# Patient Record
Sex: Female | Born: 1980 | Race: White | Hispanic: No | Marital: Single | Smoking: Former smoker
Health system: Southern US, Community
[De-identification: ages and names within clinical notes are randomized; demographics above are authoritative.]

## PROBLEM LIST (undated history)

## (undated) DIAGNOSIS — F419 Anxiety disorder, unspecified: Secondary | ICD-10-CM

## (undated) DIAGNOSIS — E063 Autoimmune thyroiditis: Secondary | ICD-10-CM

## (undated) HISTORY — PX: OTHER SURGICAL HISTORY: SHX169

## (undated) HISTORY — DX: Anxiety disorder, unspecified: F41.9

## (undated) HISTORY — DX: Hemochromatosis, unspecified: E83.119

## (undated) HISTORY — DX: Autoimmune thyroiditis: E06.3

## (undated) HISTORY — PX: CHOLECYSTECTOMY: SHX55

## (undated) HISTORY — PX: WISDOM TOOTH EXTRACTION: SHX21

---

## 2013-12-01 DIAGNOSIS — Z1501 Genetic susceptibility to malignant neoplasm of breast: Secondary | ICD-10-CM | POA: Insufficient documentation

## 2013-12-01 HISTORY — DX: Genetic susceptibility to malignant neoplasm of breast: Z15.01

## 2016-02-11 DIAGNOSIS — G43109 Migraine with aura, not intractable, without status migrainosus: Secondary | ICD-10-CM | POA: Insufficient documentation

## 2021-05-10 DIAGNOSIS — E663 Overweight: Secondary | ICD-10-CM | POA: Insufficient documentation

## 2021-08-04 ENCOUNTER — Other Ambulatory Visit: Payer: Self-pay | Admitting: Family Medicine

## 2021-08-04 DIAGNOSIS — Z1231 Encounter for screening mammogram for malignant neoplasm of breast: Secondary | ICD-10-CM

## 2021-08-26 ENCOUNTER — Other Ambulatory Visit (HOSPITAL_COMMUNITY): Payer: Self-pay

## 2021-08-26 MED ORDER — CLONAZEPAM 0.5 MG PO TABS
ORAL_TABLET | ORAL | 0 refills | Status: DC
  Filled 2021-08-26: qty 120, 30d supply, fill #0

## 2021-08-26 MED ORDER — WEGOVY 2.4 MG/0.75ML ~~LOC~~ SOAJ
SUBCUTANEOUS | 1 refills | Status: DC
  Filled 2021-08-26 – 2021-09-05 (×2): qty 3, 28d supply, fill #0
  Filled 2022-01-04: qty 9, 84d supply, fill #0

## 2021-08-26 MED ORDER — GABAPENTIN 100 MG PO CAPS
100.0000 mg | ORAL_CAPSULE | Freq: Three times a day (TID) | ORAL | 2 refills | Status: DC | PRN
  Filled 2021-08-26: qty 30, 10d supply, fill #0

## 2021-08-26 MED ORDER — NP THYROID 15 MG PO TABS
ORAL_TABLET | ORAL | 0 refills | Status: DC
  Filled 2021-08-26: qty 90, 90d supply, fill #0

## 2021-08-26 MED ORDER — VALACYCLOVIR HCL 1 G PO TABS
1000.0000 mg | ORAL_TABLET | Freq: Three times a day (TID) | ORAL | 2 refills | Status: DC
  Filled 2021-08-26 – 2021-11-25 (×2): qty 21, 7d supply, fill #0

## 2021-08-26 MED ORDER — FLUOXETINE HCL 20 MG PO CAPS
ORAL_CAPSULE | ORAL | 1 refills | Status: DC
  Filled 2021-08-26: qty 90, 90d supply, fill #0
  Filled 2022-03-24: qty 90, 90d supply, fill #1

## 2021-08-29 DIAGNOSIS — E538 Deficiency of other specified B group vitamins: Secondary | ICD-10-CM | POA: Diagnosis not present

## 2021-08-29 DIAGNOSIS — E782 Mixed hyperlipidemia: Secondary | ICD-10-CM | POA: Diagnosis not present

## 2021-08-29 DIAGNOSIS — E559 Vitamin D deficiency, unspecified: Secondary | ICD-10-CM | POA: Diagnosis not present

## 2021-08-29 DIAGNOSIS — R739 Hyperglycemia, unspecified: Secondary | ICD-10-CM | POA: Diagnosis not present

## 2021-08-29 DIAGNOSIS — K2100 Gastro-esophageal reflux disease with esophagitis, without bleeding: Secondary | ICD-10-CM | POA: Diagnosis not present

## 2021-08-29 DIAGNOSIS — E063 Autoimmune thyroiditis: Secondary | ICD-10-CM | POA: Diagnosis not present

## 2021-09-02 ENCOUNTER — Other Ambulatory Visit (HOSPITAL_COMMUNITY): Payer: Self-pay

## 2021-09-05 ENCOUNTER — Other Ambulatory Visit (HOSPITAL_COMMUNITY): Payer: Self-pay

## 2021-09-09 ENCOUNTER — Other Ambulatory Visit (HOSPITAL_COMMUNITY): Payer: Self-pay

## 2021-09-13 ENCOUNTER — Other Ambulatory Visit (HOSPITAL_COMMUNITY): Payer: Self-pay

## 2021-09-13 MED ORDER — CHOLECALCIFEROL 1.25 MG (50000 UT) PO CAPS
50000.0000 [IU] | ORAL_CAPSULE | ORAL | 1 refills | Status: DC
  Filled 2021-09-13: qty 3, 90d supply, fill #0
  Filled 2021-11-25: qty 3, 90d supply, fill #1

## 2021-09-22 ENCOUNTER — Ambulatory Visit: Payer: Self-pay

## 2021-10-13 ENCOUNTER — Other Ambulatory Visit (HOSPITAL_COMMUNITY): Payer: Self-pay

## 2021-10-13 MED ORDER — WEGOVY 2.4 MG/0.75ML ~~LOC~~ SOAJ
SUBCUTANEOUS | 1 refills | Status: DC
  Filled 2021-10-13: qty 3, 28d supply, fill #0
  Filled 2021-11-25: qty 3, 28d supply, fill #1
  Filled 2022-01-04 – 2022-03-24 (×2): qty 3, 28d supply, fill #2
  Filled 2022-04-27: qty 3, 28d supply, fill #3
  Filled 2022-05-25: qty 3, 28d supply, fill #4
  Filled 2022-08-29: qty 3, 28d supply, fill #5
  Filled 2022-09-27: qty 3, 28d supply, fill #6

## 2021-10-25 ENCOUNTER — Other Ambulatory Visit: Payer: Self-pay

## 2021-10-25 ENCOUNTER — Ambulatory Visit
Admission: RE | Admit: 2021-10-25 | Discharge: 2021-10-25 | Disposition: A | Discharge status: Home / Self Care | Payer: 59 | Source: Ambulatory Visit | Attending: *Deleted | Admitting: *Deleted

## 2021-10-25 DIAGNOSIS — Z1231 Encounter for screening mammogram for malignant neoplasm of breast: Secondary | ICD-10-CM | POA: Diagnosis not present

## 2021-10-26 ENCOUNTER — Encounter: Payer: Self-pay | Admitting: Family Medicine

## 2021-10-26 ENCOUNTER — Ambulatory Visit: Payer: 59 | Admitting: Family Medicine

## 2021-10-26 DIAGNOSIS — R002 Palpitations: Secondary | ICD-10-CM

## 2021-10-26 DIAGNOSIS — E063 Autoimmune thyroiditis: Secondary | ICD-10-CM | POA: Insufficient documentation

## 2021-10-26 DIAGNOSIS — F419 Anxiety disorder, unspecified: Secondary | ICD-10-CM

## 2021-10-26 NOTE — Assessment & Plan Note (Signed)
Reports well controlled. No current symptoms other than occasional palpitations. States she just had labs done recently and will send [place] the results via MyChart instead of drawing blood again today. Continue current regimen for now.

## 2021-10-26 NOTE — Patient Instructions (Signed)
Referrals placed today Please send [place] your recent lab results via [place] If it is going to take you awhile to get in with Cardiology and you would like [place] to go ahead and order a heart monitor, please let us know.

## 2021-10-26 NOTE — Assessment & Plan Note (Signed)
No suicidal/homicidal ideation Well controlled on current regimen, but she is open to counseling. Educated that I would prefer not to prescribe this much Klonopin and would either like to start weaning the amount she gets or refer to psych for management - she is agreeable to have referral placed.

## 2021-10-26 NOTE — Assessment & Plan Note (Signed)
Fatigue is at baseline. Referral to hematology. Reports recent labs - she will send [place] results.

## 2021-10-26 NOTE — Progress Notes (Deleted)
There were no vitals taken for this visit.   Subjective:    Patient ID: Amber Sweeney, female    DOB: 08/15/1981, 41 y.o.   MRN: [id number]  HPI: Amber Sweeney is a 41 y.o. female presenting on 10/26/2021 for comprehensive medical examination and to establish care with new PCP. Current medical complaints include:***  She currently lives with: Interim Problems from her last visit: no   She reports regular vision exams q1-5y: {Blank single:19197::"yes","no"} She reports regular dental exams q 2550m: {Blank single:19197::"yes","no"} Her diet consists of: *** She endorses exercise and/or activity of: *** She works at: ***  She {Blank single:19197::"denies","endorses"} ETOH use. She {Blank single:19197::"denies","endorses"} nictoine use. She {Blank single:19197::"denies","endorses"} illegal substance use.    She reports ***irregular/irregular menstrual periods with {Blank single:"normal","heavy","light"} flow. Current menopausal symptoms: no She is {Blank single:19197::"currently","not currently","never"}  sexually active with {Blank single:19197} partners. She {Blank single:19197::"denies","endorses"}  concerns today about STI Contraception choices are: ***  She {Blank single:19197::"endorses","denies"} concerns about skin changes today. She {Blank single:19197::"endorses","denies"} concerns about bowel changes today. She {Blank single:19197::"endorses","denies"} concerns about bladder changes today.  *** Depression Screen done today and results listed below:  No flowsheet data found.  She {has/does not have:19849} a history of falls. I {did/did not:19850} complete a risk assessment for falls. A plan of care for falls {was/was not:19852} documented.     Past Medical History:  No past medical history on file.  Surgical History:  No past surgical history on file.  Medications:  Current Outpatient Medications on File Prior to Visit  Medication Sig   Cholecalciferol  1.25 MG (50000 UT) capsule Take 1 capsule (50,000 Units) by mouth every 30 (thirty) days.   clonazePAM (KLONOPIN) 0.5 MG tablet TAKE 1 TO 2 TABLETS BY MOUTH TWICE DAILY FOR ANXIETY   FLUoxetine (PROZAC) 20 MG capsule TAKE 1 CAPSULE BY MOUTH EVERY DAY IN THE MORNING   gabapentin (NEURONTIN) 100 MG capsule Take 1 capsule (100 mg total) by mouth 3 (three) times daily as needed for nerve pain   Semaglutide-Weight Management (WEGOVY) 2.4 MG/0.75ML SOAJ Inject 2.4MG  into the skin every week on the same day   Semaglutide-Weight Management (WEGOVY) 2.4 MG/0.75ML SOAJ Inject (2.4MG ) into the skin every week on the same day of each week   thyroid (NP THYROID) 15 MG tablet TAKE 1 TABLET BY MOUTH FIRST THING IN THE MORNING ON AN EMPTY STOMACH   valACYclovir (VALTREX) 1000 MG tablet Take 1 tablet (1,000 mg total) by mouth 3 (three) times daily for 7 days   No current facility-administered medications on file prior to visit.    Allergies:  Allergies  Allergen Reactions   Celecoxib     pt reported   Gabapentin     pt reported   Liraglutide -Weight Management     pt reported    Social History:  Social History   Socioeconomic History   Marital status: Single    Spouse name: Not on file   Number of children: Not on file   Years of education: Not on file   Highest education level: Not on file  Occupational History   Not on file  Tobacco Use   Smoking status: Not on file   Smokeless tobacco: Not on file  Substance and Sexual Activity   Alcohol use: Not on file   Drug use: Not on file   Sexual activity: Not on file  Other Topics Concern   Not on file  Social History Narrative   Not on file  Social Determinants of Health   Financial Resource Strain: Not on file  Food Insecurity: Not on file  Transportation Needs: Not on file  Physical Activity: Not on file  Stress: Not on file  Social Connections: Not on file  Intimate Partner Violence: Not on file   Social History   Tobacco Use   Smoking Status Not on file  Smokeless Tobacco Not on file   Social History   Substance and Sexual Activity  Alcohol Use Not on file    Family History:  Family History  Problem Relation Age of Onset   Breast cancer Maternal Aunt     Past medical history, surgical history, medications, allergies, family history and social history reviewed with patient today and changes made to appropriate areas of the chart.   All ROS negative except what is listed above and in the HPI.      Objective:    There were no vitals taken for this visit.  Wt Readings from Last 3 Encounters:  No data found for Wt    Physical Exam  No results found for this or any previous visit.    Assessment & Plan:   Problem List Items Addressed This Visit   None      LABORATORY TESTING:  - Health maintenance labs ordered today as discussed above.           CBC, CMP, LIPIDS          TSH - high risk or symptoms          A1c - hx, high risk, symptomatic  - STI testing: {Blank single:19197::"deferred","done today","not applicable","up to date","done elsewhere"} (USPSTF recommends Gonorrhea/Chlamydia screening (urine) in all sexually active females (or on OCP) age 57 and under; continue after age 20 if high risk) - Pap smear: {Blank single:19197::"deferred","done today","not applicable","up to date","done elsewhere"} - HIV screening: *** (everyone at least once between ages of 59-64) - Hep C screening: *** (if high risk) - Syphilis screening: *** (MSM, HIV+, high risk)   IMMUNIZATIONS:   - Tdap: Tetanus vaccination status reviewed: {tetanus status:315746}. - Influenza: {Blank single:19197::"Up to date","Administered today","Postponed to flu season","Refused","Given elsewhere"} - Pneumovax: {Blank single:19197::"Up to date","Administered today","Not applicable","Refused","Given elsewhere"} - Prevnar: {Blank single:19197::"Up to date","Administered today","Not applicable","Refused","Given elsewhere"} -  HPV: {Blank single:19197::"Up to date","Administered today","Not applicable","Refused","Given elsewhere"} - Shingrix vaccine: {Blank single:19197::"Up to date","Administered today","Not applicable","Refused","Given elsewhere"} - COVID-19: {Blank single:19197::"Up to date","Administered today","Not applicable","Refused","Given elsewhere"}  SCREENING: - Mammogram: {Blank single:19197::"Up to date","Ordered today","Not applicable","Refused","Done elsewhere"} - Bone Density: {Blank single:19197::"Up to date","Ordered today","Not applicable","Refused","Done elsewhere"} - Colonoscopy: {Blank single:19197::"Up to date","Ordered today","Not applicable","Refused","Done elsewhere"}  Discussed with patient purpose of the colonoscopy is to detect colon cancer at curable precancerous or early stages  - AAA Screening: {Blank single:19197::"Up to date","Ordered today","Not applicable","Refused","Done elsewhere"}  -Hearing Test: {Blank single:19197::"Up to date","Ordered today","Not applicable","Refused","Done elsewhere"} *** ? -Spirometry: {Blank single:19197::"Up to date","Ordered today","Not applicable","Refused","Done elsewhere"}  - Lung Cancer Screening: {Blank single:19197::"Up to date","Ordered today","Not applicable","Refused","Done elsewhere"}    PATIENT COUNSELING:   Advised to take 1 mg of folate supplement per day if capable of pregnancy.   Sexuality: Discussed sexually transmitted diseases, partner selection, use of condoms, avoidance of unintended pregnancy, and contraceptive alternatives.   Encouraged smoking cessation. *** I discussed with the patient that most people either abstain from alcohol or drink within safe limits (<=14/week and <=4 drinks/occasion for males, <=7/weeks and <= 3 drinks/occasion for females) and that the risk for alcohol disorders and other health effects rises proportionally with the number of drinks  per week and how often a drinker exceeds daily limits.  Discussed  cessation/primary prevention of drug use and availability of treatment for abuse.   Diet: Encouraged to adjust caloric intake to maintain or achieve ideal body weight, to reduce intake of dietary saturated fat and total fat, to limit sodium intake by avoiding high sodium foods and not adding table salt, and to maintain adequate dietary potassium and calcium preferably from fresh fruits, vegetables, and low-fat dairy products. Encouraged vitamin D 1000 units and Calcium 1300mg  or 4 servings of dairy a day.  Emphasized the importance of regular exercise.  Injury prevention: Discussed safety belts, safety helmets, smoke detector, smoking near bedding or upholstery.   Dental health: Discussed importance of regular tooth brushing, flossing, and dental visits.  Follow up plan:  No follow-ups on file.

## 2021-10-26 NOTE — Progress Notes (Signed)
______________________________________________________________________  HPI Amber Sweeney is a 41 y.o. female presenting to [facility] today to establish care. She and her children moved here about 4 months ago from [place]. 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     Health Maintenance  Topic Date Due   HIV Screening  Never done   Hepatitis C Screening: USPSTF Recommendation to screen - Ages 43-79 yo.  Never done   Pap Smear  Never done   COVID-19 Vaccine (3 - Booster for Pfizer series) 02/19/2020   Tetanus Vaccine  03/08/2021   Flu Shot  05/09/2021   Pneumococcal Vaccination  Aged Out   HPV Vaccine  Aged Out     Concerns today: Hemochromatosis - was set up with hematologist before moving; high ferritin, was told to give blood often (once a week for eight weeks - only did it once), baseline fatigue, metallic tastes in mouth  Wants to see a cardiologist - ever since COVID shots, she has been having spurts of HTN, resolved now, frequent dizziness; smartwatch has told her she is in Afib a few times (brother with Afib) - It has been at least 2 weeks since she has had any of these symptoms. Reports usually if she sits for a few minutes and drinks water it will pass within a couple minutes; wore a monitor 3 years ago, but it didn't catch anything. Last had blood work done a few weeks ago - reports thyroid has been stable Anxiety/PTSD - would like to see a Veterinary surgeon. She is having to use Klonopin 0.5 mg at least once a day - she is willing to see psych to manage this. Also on daily Prozac. Denies suicidal/homicidal ideation.   Patient Active Problem List   Diagnosis Date Noted   Hereditary hemochromatosis (HCC) 10/26/2021   Hashimoto's thyroiditis 10/26/2021   Anxiety 10/26/2021      PHQ9 Today: Depression screen PHQ 2/9 10/26/2021  Decreased Interest 0  Down, Depressed, Hopeless 0  PHQ - 2 Score 0   GAD7 Today: No flowsheet data  found. ______________________________________________________________________ PMH Past Medical History:  Diagnosis Date   Anxiety    Hashimoto's thyroiditis    Hemochromatosis     ROS All review of systems negative except what is listed in the HPI  PHYSICAL EXAM Physical Exam Vitals reviewed.  Constitutional:      General: She is not in acute distress.    Appearance: Normal appearance.  HENT:     Head: Normocephalic and atraumatic.     Right Ear: Tympanic membrane normal.     Left Ear: Tympanic membrane normal.     Mouth/Throat:     Mouth: Mucous membranes are moist.     Pharynx: Oropharynx is clear.  Cardiovascular:     Rate and Rhythm: Normal rate and regular rhythm.     Heart sounds: Normal heart sounds.  Pulmonary:     Effort: Pulmonary effort is normal.     Breath sounds: Normal breath sounds.  Abdominal:     Palpations: Abdomen is soft.  Musculoskeletal:        General: Normal range of motion.     Cervical back: Normal range of motion and neck supple. No tenderness.  Lymphadenopathy:     Cervical: No cervical adenopathy.  Skin:    General: Skin is warm and dry.  Neurological:     General: No focal deficit present.     Mental Status: She is alert and oriented  to person, place, and time. Mental status is at baseline.  Psychiatric:        Mood and Affect: Mood normal.        Behavior: Behavior normal.        Thought Content: Thought content normal.        Judgment: Judgment normal.   ______________________________________________________________________ ASSESSMENT AND PLAN  Hashimoto's thyroiditis Reports well controlled. No current symptoms other than occasional palpitations. States she just had labs done recently and will send [place] the results via MyChart instead of drawing blood again today. Continue current regimen for now.   Hereditary hemochromatosis (HCC) Fatigue is at baseline. Referral to hematology. Reports recent labs - she will send [place] results.    Anxiety No suicidal/homicidal ideation Well controlled on current regimen, but she is open to counseling. Educated that I would prefer not to prescribe this much Klonopin and would either like to start weaning the amount she gets or refer to psych for management - she is agreeable to have referral placed.   Palpitations Patient requesting cardiology referral. Deferred starting workup today. Educated that if it sounds like it will take her awhile to get in with cardiology to let me know and we can set up heart monitor. No symptoms today. Patient aware of signs/symptoms requiring further/urgent evaluation.    - Ambulatory referral to Cardiology - Ambulatory referral to Hematology / Oncology - Ambulatory referral to Psychiatry    Diet and Exercise recommendations discussed.  Current diagnoses and recommendations discussed. HM recommendations reviewed with recommendations.  -She reports mammogram was completed yesterday -Due for pap, will schedule CPE in a few months to have this done and update labs again   Outpatient Encounter Medications as of 10/26/2021  Medication Sig   Cholecalciferol 1.25 MG (50000 UT) capsule Take 1 capsule (50,000 Units) by mouth every 30 (thirty) days.   clonazePAM (KLONOPIN) 0.5 MG tablet TAKE 1 TO 2 TABLETS BY MOUTH TWICE DAILY FOR ANXIETY   FLUoxetine (PROZAC) 20 MG capsule TAKE 1 CAPSULE BY MOUTH EVERY DAY IN THE MORNING   Semaglutide-Weight Management (WEGOVY) 2.4 MG/0.75ML SOAJ Inject 2.4MG  into the skin every week on the same day   Semaglutide-Weight Management (WEGOVY) 2.4 MG/0.75ML SOAJ Inject (2.4MG ) into the skin every week on the same day of each week   thyroid (NP THYROID) 15 MG tablet TAKE 1 TABLET BY MOUTH FIRST THING IN THE MORNING ON AN EMPTY STOMACH   valACYclovir (VALTREX) 1000 MG tablet Take 1 tablet (1,000 mg total) by mouth 3 (three) times daily for 7 days   [DISCONTINUED] gabapentin (NEURONTIN) 100 MG capsule Take 1 capsule (100 mg  total) by mouth 3 (three) times daily as needed for nerve pain   No facility-administered encounter medications on file as of 10/26/2021.    Return in about 3 months (around 01/24/2022) for CPE, pap, labs .  Time: 32 minutes, >50% spent counseling, care coordination, chart review, and documentation.   Lollie Marrow Reola Calkins, DNP, FNP-C

## 2021-10-27 ENCOUNTER — Encounter: Payer: Self-pay | Admitting: Family Medicine

## 2021-11-01 ENCOUNTER — Emergency Department (HOSPITAL_COMMUNITY): Payer: 59

## 2021-11-01 ENCOUNTER — Other Ambulatory Visit: Payer: Self-pay | Admitting: Family

## 2021-11-01 ENCOUNTER — Emergency Department (HOSPITAL_COMMUNITY)
Admission: EM | Admit: 2021-11-01 | Discharge: 2021-11-01 | Disposition: A | Discharge status: Home / Self Care | Payer: 59 | Attending: Emergency Medicine | Admitting: Emergency Medicine

## 2021-11-01 ENCOUNTER — Encounter (HOSPITAL_COMMUNITY): Payer: Self-pay | Admitting: Emergency Medicine

## 2021-11-01 DIAGNOSIS — R0602 Shortness of breath: Secondary | ICD-10-CM | POA: Diagnosis not present

## 2021-11-01 DIAGNOSIS — R002 Palpitations: Secondary | ICD-10-CM | POA: Diagnosis not present

## 2021-11-01 DIAGNOSIS — R42 Dizziness and giddiness: Secondary | ICD-10-CM | POA: Insufficient documentation

## 2021-11-01 DIAGNOSIS — R079 Chest pain, unspecified: Secondary | ICD-10-CM | POA: Diagnosis not present

## 2021-11-01 LAB — COMPREHENSIVE METABOLIC PANEL
ALT: 17 U/L (ref 0–44)
AST: 16 U/L (ref 15–41)
Albumin: 4.1 g/dL (ref 3.5–5.0)
Alkaline Phosphatase: 44 U/L (ref 38–126)
Anion gap: 8 (ref 5–15)
BUN: 15 mg/dL (ref 6–20)
CO2: 25 mmol/L (ref 22–32)
Calcium: 8.9 mg/dL (ref 8.9–10.3)
Chloride: 105 mmol/L (ref 98–111)
Creatinine, Ser: 0.92 mg/dL (ref 0.44–1.00)
GFR, Estimated: >60 mL/min (ref >60)
Glucose, Bld: 90 mg/dL (ref 70–99)
Potassium: 4.3 mmol/L (ref 3.5–5.1)
Sodium: 138 mmol/L (ref 135–145)
Total Bilirubin: 0.5 mg/dL (ref 0.3–1.2)
Total Protein: 6.6 g/dL (ref 6.5–8.1)

## 2021-11-01 LAB — TSH: TSH: 1.534 u[IU]/mL (ref 0.350–4.500)

## 2021-11-01 LAB — URINALYSIS, ROUTINE W REFLEX MICROSCOPIC
Bilirubin Urine: NEGATIVE
Glucose, UA: NEGATIVE mg/dL
Hgb urine dipstick: NEGATIVE
Ketones, ur: NEGATIVE mg/dL
Leukocytes,Ua: NEGATIVE
Nitrite: NEGATIVE
Protein, ur: NEGATIVE mg/dL
Specific Gravity, Urine: 1.013 (ref 1.005–1.030)
pH: 6 (ref 5.0–8.0)

## 2021-11-01 LAB — CBC WITH DIFFERENTIAL/PLATELET
Abs Immature Granulocytes: 0.02 10*3/uL (ref 0.00–0.07)
Basophils Absolute: 0.1 10*3/uL (ref 0.0–0.1)
Basophils Relative: 1 %
Eosinophils Absolute: 0.1 10*3/uL (ref 0.0–0.5)
Eosinophils Relative: 2 %
HCT: 36.6 % (ref 36.0–46.0)
Hemoglobin: 12.4 g/dL (ref 12.0–15.0)
Immature Granulocytes: 0 %
Lymphocytes Relative: 35 %
Lymphs Abs: 2.5 10*3/uL (ref 0.7–4.0)
MCH: 32.5 pg (ref 26.0–34.0)
MCHC: 33.9 g/dL (ref 30.0–36.0)
MCV: 95.8 fL (ref 80.0–100.0)
Monocytes Absolute: 0.3 10*3/uL (ref 0.1–1.0)
Monocytes Relative: 5 %
Neutro Abs: 4.1 10*3/uL (ref 1.7–7.7)
Neutrophils Relative %: 57 %
Platelets: 259 10*3/uL (ref 150–400)
RBC: 3.82 MIL/uL — ABNORMAL LOW (ref 3.87–5.11)
RDW: 12.8 % (ref 11.5–15.5)
WBC: 7.1 10*3/uL (ref 4.0–10.5)
nRBC: 0 % (ref 0.0–0.2)

## 2021-11-01 LAB — PREGNANCY, URINE: Preg Test, Ur: NEGATIVE

## 2021-11-01 LAB — TROPONIN I (HIGH SENSITIVITY)
Troponin I (High Sensitivity): <2 ng/L (ref <18)
Troponin I (High Sensitivity): <2 ng/L (ref <18)

## 2021-11-01 NOTE — ED Provider Notes (Signed)
[facility] EMERGENCY DEPARTMENT Provider Note   CSN: [id number] Arrival date & time: 11/01/21  [id number]     History  Chief Complaint  Patient presents with   Dizziness    Amber Sweeney is a 41 y.o. female.   Dizziness Associated symptoms: palpitations   Associated symptoms: no chest pain, no nausea and no shortness of breath   Patient presents for palpitations and dizziness.  She has a history of palpitations and has worn a cardiac monitor in the past.  Data from this monitor showed infrequent PVCs only.  This morning, patient experienced palpitations.  She does have some associated dizziness.  She has a watch that monitors heart rate.  The wash told her that her heart rate was fast.  She was not able to identify a peak heart rate because she did not want to keep checking it and fears that it would make her more anxious and worsen her symptoms.  She does have an appointment with cardiology for next week.    Home Medications Prior to Admission medications   Medication Sig Start Date End Date Taking? Authorizing Provider  Cholecalciferol 1.25 MG (50000 UT) capsule Take 1 capsule (50,000 Units) by mouth every 30 (thirty) days. 09/13/21     clonazePAM (KLONOPIN) 0.5 MG tablet TAKE 1 TO 2 TABLETS BY MOUTH TWICE DAILY FOR ANXIETY 08/26/21     FLUoxetine (PROZAC) 20 MG capsule TAKE 1 CAPSULE BY MOUTH EVERY DAY IN THE MORNING 08/26/21     Semaglutide-Weight Management (WEGOVY) 2.4 MG/0.75ML SOAJ Inject 2.4MG  into the skin every week on the same day 08/26/21     Semaglutide-Weight Management (WEGOVY) 2.4 MG/0.75ML SOAJ Inject (2.4MG ) into the skin every week on the same day of each week 10/13/21     thyroid (NP THYROID) 15 MG tablet TAKE 1 TABLET BY MOUTH FIRST THING IN THE MORNING ON AN EMPTY STOMACH 08/26/21     valACYclovir (VALTREX) 1000 MG tablet Take 1 tablet (1,000 mg total) by mouth 3 (three) times daily for 7 days 08/26/21         Allergies    Celecoxib,  Gabapentin, Iodinated contrast media, Levothyroxine sodium, Liraglutide -weight management, Pregabalin, Wellbutrin [bupropion], Cephalosporins, and Cyclosporine    Review of Systems   Review of Systems  Respiratory:  Negative for shortness of breath.   Cardiovascular:  Positive for palpitations. Negative for chest pain.  Gastrointestinal:  Negative for nausea.  Neurological:  Positive for dizziness.  All other systems reviewed and are negative.  Physical Exam Updated Vital Signs BP 95/71 (BP Location: Right Arm)    Pulse 71    Temp 98 F (36.7 C) (Oral)    Resp 15    SpO2 100%  Physical Exam Vitals and nursing note reviewed.  Constitutional:      General: She is not in acute distress.    Appearance: Normal appearance. She is well-developed and normal weight. She is not ill-appearing, toxic-appearing or diaphoretic.  HENT:     Head: Normocephalic and atraumatic.     Right Ear: External ear normal.     Left Ear: External ear normal.     Nose: Nose normal.     Mouth/Throat:     Mouth: Mucous membranes are moist.     Pharynx: Oropharynx is clear.  Eyes:     General: No scleral icterus.    Extraocular Movements: Extraocular movements intact.     Conjunctiva/sclera: Conjunctivae normal.  Cardiovascular:     Rate and Rhythm: Normal  rate and regular rhythm.     Heart sounds: No murmur heard. Pulmonary:     Effort: Pulmonary effort is normal. No respiratory distress.     Breath sounds: Normal breath sounds. No wheezing or rales.  Chest:     Chest wall: No tenderness.  Abdominal:     General: Abdomen is flat.     Palpations: Abdomen is soft.     Tenderness: There is no abdominal tenderness.  Musculoskeletal:        General: No swelling. Normal range of motion.     Cervical back: Normal range of motion and neck supple. No rigidity.     Right lower leg: No edema.     Left lower leg: No edema.  Skin:    General: Skin is warm and dry.     Capillary Refill: Capillary refill takes  less than 2 seconds.     Coloration: Skin is not jaundiced or pale.  Neurological:     General: No focal deficit present.     Mental Status: She is alert and oriented to person, place, and time.     Cranial Nerves: No cranial nerve deficit.     Sensory: No sensory deficit.     Motor: No weakness.     Coordination: Coordination normal.  Psychiatric:        Mood and Affect: Mood normal.        Behavior: Behavior normal.        Thought Content: Thought content normal.        Judgment: Judgment normal.    ED Results / Procedures / Treatments   Labs (all labs ordered are listed, but only abnormal results are displayed) Labs Reviewed  CBC WITH DIFFERENTIAL/PLATELET - Abnormal; Notable for the following components:      Result Value   RBC 3.82 (*)    All other components within normal limits  URINALYSIS, ROUTINE W REFLEX MICROSCOPIC - Abnormal; Notable for the following components:   Color, Urine STRAW (*)    All other components within normal limits  COMPREHENSIVE METABOLIC PANEL  TSH  PREGNANCY, URINE  TROPONIN I (HIGH SENSITIVITY)  TROPONIN I (HIGH SENSITIVITY)    EKG EKG Interpretation  Date/Time:  Tuesday November 01 2021 09:17:44 EST Ventricular Rate:  90 PR Interval:  136 QRS Duration: 76 QT Interval:  378 QTC Calculation: 462 R Axis:   61 Text Interpretation: Normal sinus rhythm Normal ECG No previous ECGs available Confirmed by Godfrey Pick ([id number]) on 11/01/2021 11:25:46 AM  Radiology DG Chest 2 View  Result Date: 11/01/2021 CLINICAL DATA:  Pain across her chest with shortness of breath for few months EXAM: CHEST - 2 VIEW COMPARISON:  None. FINDINGS: The cardiomediastinal silhouette is normal. There is no focal consolidation or pulmonary edema. There is no pleural effusion or pneumothorax. There is no acute osseous abnormality. IMPRESSION: No radiographic evidence of acute cardiopulmonary process. Electronically Signed   By: Valetta Mole M.D.   On: 11/01/2021 10:20     Procedures Procedures    Medications Ordered in ED Medications - No data to display  ED Course/ Medical Decision Making/ A&P                           Medical Decision Making Amount and/or Complexity of Data Reviewed Labs: ordered.   Healthy 41 year old female presenting for palpitations and dizziness.  Symptoms were present this morning.  Palpitations have resolved on arrival in the ED.  Past  medical history is notable for anxiety and Hashimoto's thyroiditis.  Differential diagnosis includes paroxysmal atrial fibrillation, SVT, other tachydysrhythmia, anxiety, polypharmacy, medication withdrawal, dehydration, and PE.  On arrival in the ED, patient has a normal heart rate.  EKG shows normal sinus rhythm with narrow QRS.  There is flattening of T waves in lead III.  No other prior EKGs are available for comparison.  Prior to being bedded in the ED, lab work was obtained.  Results showed no anemia, normal electrolytes, and normal troponins.  She is well-appearing on exam.  She denies any current symptoms.  Unfortunately, patient was bedded in the hallway and no cardiac monitor was available.  She did, however, continue to deny any symptoms on reassessments.  Patient is prescribed Klonopin and there could be a withdrawal component to the recent symptoms she has been having.  She has a history of thyroiditis, which has been stable.  She was also diagnosed with hemochromatosis, unlikely to be causing current symptoms in the setting of reassuring lab work today.  Fortunately, patient does have a an appointment with cardiology scheduled for next week.  She may benefit from analysis with a cardiac monitor.  The last time she wore cardiac monitor was 3 years ago.  Patient was advised to keep this appointment and to return to the ED for any worsening of symptoms.  She was discharged in good condition.        Final Clinical Impression(s) / ED Diagnoses Final diagnoses:  Palpitations    Rx / DC  Orders ED Discharge Orders     None         Godfrey Pick, MD 11/02/21 [phone]
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
## 2021-11-01 NOTE — Discharge Instructions (Signed)
Keep your cardiology appointment as scheduled.  Please return to the emergency department for any worsening symptoms.

## 2021-11-01 NOTE — ED Notes (Signed)
X1 BLUE TOP SAVE @  MAIN LAB

## 2021-11-01 NOTE — ED Triage Notes (Signed)
Pt. Stated, Amber Sweeney been dizzy and Im in AFib. Pt has not been dx with Afib but stated her watch told her. Ive been dizzy for about 30 min on the way to work

## 2021-11-02 ENCOUNTER — Inpatient Hospital Stay (HOSPITAL_BASED_OUTPATIENT_CLINIC_OR_DEPARTMENT_OTHER): Payer: 59 | Admitting: Family

## 2021-11-02 ENCOUNTER — Encounter: Payer: Self-pay | Admitting: Family

## 2021-11-02 ENCOUNTER — Other Ambulatory Visit: Payer: Self-pay

## 2021-11-02 ENCOUNTER — Inpatient Hospital Stay: Payer: 59 | Attending: Hematology & Oncology

## 2021-11-02 DIAGNOSIS — F17290 Nicotine dependence, other tobacco product, uncomplicated: Secondary | ICD-10-CM | POA: Diagnosis not present

## 2021-11-02 NOTE — Progress Notes (Signed)
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  She has been donating every 2 months with the [employer]. Her last donation was on 10/29/2021.  She notes fatigued, headaches, dizziness, SOB with exertion, chest discomfort, palpitations and itching after a hot bath or shower.  She has also been referred to Cardiology for further work up for chest discomfort and palpitations.  Her mother had a valve replacement and brother has history of atrial fib.  Mammogram last week was negative.  She is BRCA 2 positive along with her sisters and aunt.  Maternal great aunt and maternal aunt with breast cancer. Her maternal grandfather had colon cancer.  No history of diabetes.  She has history of Hashimoto's thyroiditis and is on armor thyroid.  She has had her ovaries removed and a cholecystectomy without any complications.  She has an itchy rash on her left outer ankle and is using a medicated cream. She notes that this seems a little better.  No fever, chills, n/v, cough, abdominal pain or changes in bowel or bladder habits.  No swelling, tenderness, numbness or tingling in her extremities at this time.  No falls or syncope to report.  She has a good appetite and is staying well hydrated. Her weight is stable at 173 lbs.  She quit smoking 2 years ago but is vaping. No ETOH or recreational drug use.  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   ROS: All other 10 point review of systems is negative.   PAST MEDICAL HISTORY:   Past Medical History:  Diagnosis Date    Anxiety    Hashimoto's thyroiditis    Hemochromatosis     ALLERGIES: Allergies  Allergen Reactions   Celecoxib Other (See Comments)    Patient stated it gave her pancreatitis   Gabapentin Palpitations   Iodinated Contrast Media Other (See Comments)    Panic Attack   Levothyroxine Sodium Palpitations   Liraglutide -Weight Management Other (See Comments)    Injection site turned black for months   Pregabalin Other (See Comments)    Panic attack     Wellbutrin [Bupropion] Other (See Comments)    "I didn't care about anything"   Cephalosporins Rash   Cyclosporine Hives      MEDICATIONS:  Current Outpatient Medications on File Prior to Visit  Medication Sig Dispense Refill   Cholecalciferol 1.25 MG (50000 UT) capsule Take 1 capsule (50,000 Units) by mouth every 30 (thirty) days. 3 capsule 1   clonazePAM (KLONOPIN) 0.5 MG tablet TAKE 1 TO 2 TABLETS BY MOUTH TWICE DAILY FOR ANXIETY 120 tablet 0   FLUoxetine (PROZAC) 20 MG capsule TAKE 1 CAPSULE BY MOUTH EVERY DAY IN THE MORNING 90 capsule 1   Semaglutide-Weight Management (WEGOVY) 2.4 MG/0.75ML SOAJ Inject 2.4MG into the skin every week on the same day 12 mL 1   Semaglutide-Weight Management (WEGOVY) 2.4 MG/0.75ML SOAJ Inject (2.4MG) into the skin every week on the same day of each week 12 mL 1   thyroid (NP THYROID) 15 MG tablet TAKE 1 TABLET BY MOUTH FIRST THING IN  THE MORNING ON AN EMPTY STOMACH 90 tablet 0   valACYclovir (VALTREX) 1000 MG tablet Take 1 tablet (1,000 mg total) by mouth 3 (three) times daily for 7 days 21 tablet 2   No current facility-administered medications on file prior to visit.     PAST SURGICAL HISTORY No past surgical history on file.  FAMILY HISTORY: Family History  Problem Relation Age of Onset   Breast cancer Maternal Aunt     SOCIAL HISTORY:  reports that she quit smoking about 3 years ago. Her smoking use included cigarettes. She has never used smokeless tobacco. She reports that she does not  currently use alcohol. She reports that she does not currently use drugs.  PERFORMANCE STATUS: The patient's performance status is 1 - Symptomatic but completely ambulatory  PHYSICAL EXAM: Most Recent Vital Signs: Blood pressure 92/62, pulse 75, temperature 98.5 F (36.9 C), temperature source Oral, resp. rate 18, height _0  (1.727 m), weight 173 lb 6.4 oz (78.7 kg), SpO2 100 %. BP 92/62 (BP Location: Left Arm, Patient Position: Sitting)    Pulse 75    Temp 98.5 F (36.9 C) (Oral)    Resp 18    Ht _1  (1.727 m)    Wt 173 lb 6.4 oz (78.7 kg)    SpO2 100%    BMI 26.37 kg/m   General Appearance:    Alert, cooperative, no distress, appears stated age  Head:    Normocephalic, without obvious abnormality, atraumatic  Eyes:    PERRL, conjunctiva/corneas clear, EOM's intact, fundi    benign, both eyes  Ears:    Normal TM's and external ear canals, both ears  Nose:   Nares normal, septum midline, mucosa normal, no drainage    or sinus tenderness  Throat:   Lips, mucosa, and tongue normal; teeth and gums normal  Neck:   Supple, symmetrical, trachea midline, no adenopathy;    thyroid:  no enlargement/tenderness/nodules; no carotid   bruit or JVD  Back:     Symmetric, no curvature, ROM normal, no CVA tenderness  Lungs:     Clear to auscultation bilaterally, respirations unlabored  Chest Wall:    No tenderness or deformity   Heart:    Regular rate and rhythm, S1 and S2 normal, no murmur, rub   or gallop  Breast Exam:    No tenderness, masses, or nipple abnormality  Abdomen:     Soft, non-tender, bowel sounds active all four quadrants,    no masses, no organomegaly  Genitalia:    Normal female without lesion, discharge or tenderness  Rectal:    Normal tone, normal prostate, no masses or tenderness;   guaiac negative stool  Extremities:   Extremities normal, atraumatic, no cyanosis or edema  Pulses:   2+ and symmetric all extremities  Skin:   Skin color, texture, turgor normal, no rashes or  lesions  Lymph nodes:   Cervical, supraclavicular, and axillary nodes normal  Neurologic:   CNII-XII intact, normal strength, sensation and reflexes    throughout    LABORATORY DATA:  Results for orders placed or performed during the hospital encounter of 11/01/21 (from the past 48 hour(s))  Comprehensive metabolic panel     Status: None   Collection Time: 11/01/21  9:54 AM  Result Value Ref Range   Sodium 138 135 - 145 mmol/L   Potassium 4.3 3.5 - 5.1 mmol/L   Chloride 105 98 - 111 mmol/L   CO2 25 22 - 32 mmol/L   Glucose,  Bld 90 70 - 99 mg/dL    Comment: Glucose reference range applies only to samples taken after fasting for at least 8 hours.   BUN 15 6 - 20 mg/dL   Creatinine, Ser 0.92 0.44 - 1.00 mg/dL   Calcium 8.9 8.9 - 10.3 mg/dL   Total Protein 6.6 6.5 - 8.1 g/dL   Albumin 4.1 3.5 - 5.0 g/dL   AST 16 15 - 41 U/L   ALT 17 0 - 44 U/L   Alkaline Phosphatase 44 38 - 126 U/L   Total Bilirubin 0.5 0.3 - 1.2 mg/dL   GFR, Estimated >60 >60 mL/min    Comment: (NOTE) Calculated using the CKD-EPI Creatinine Equation (2021)    Anion gap 8 5 - 15    Comment: Performed at [facility] [address] [place], [place] [address]  CBC with Differential     Status: Abnormal   Collection Time: 11/01/21  9:54 AM  Result Value Ref Range   WBC 7.1 4.0 - 10.5 K/uL   RBC 3.82 (L) 3.87 - 5.11 MIL/uL   Hemoglobin 12.4 12.0 - 15.0 g/dL   HCT 36.6 36.0 - 46.0 %   MCV 95.8 80.0 - 100.0 fL   MCH 32.5 26.0 - 34.0 pg   MCHC 33.9 30.0 - 36.0 g/dL   RDW 12.8 11.5 - 15.5 %   Platelets 259 150 - 400 K/uL   nRBC 0.0 0.0 - 0.2 %   Neutrophils Relative % 57 %   Neutro Abs 4.1 1.7 - 7.7 K/uL   Lymphocytes Relative 35 %   Lymphs Abs 2.5 0.7 - 4.0 K/uL   Monocytes Relative 5 %   Monocytes Absolute 0.3 0.1 - 1.0 K/uL   Eosinophils Relative 2 %   Eosinophils Absolute 0.1 0.0 - 0.5 K/uL   Basophils Relative 1 %   Basophils Absolute 0.1 0.0 - 0.1 K/uL   Immature Granulocytes 0 %   Abs  Immature Granulocytes 0.02 0.00 - 0.07 K/uL    Comment: Performed at [facility] [address], [place], [place] [address]  Troponin I (High Sensitivity)     Status: None   Collection Time: 11/01/21  9:54 AM  Result Value Ref Range   Troponin I (High Sensitivity) <2 <18 ng/L    Comment: (NOTE) Elevated high sensitivity troponin I (hsTnI) values and significant  changes across serial measurements may suggest ACS but many other  chronic and acute conditions are known to elevate hsTnI results.  Refer to the "Links" section for chest pain algorithms and additional  guidance. Performed at [facility], [place] [address] [place], [place] [address]   TSH     Status: None   Collection Time: 11/01/21 12:01 PM  Result Value Ref Range   TSH 1.534 0.350 - 4.500 uIU/mL    Comment: Performed by a 3rd Generation assay with a functional sensitivity of <=0.01 uIU/mL. Performed at [facility], [place] [address] [place], [place] [address]   Troponin I (High Sensitivity)     Status: None   Collection Time: 11/01/21 12:01 PM  Result Value Ref Range   Troponin I (High Sensitivity) <2 <18 ng/L    Comment: (NOTE) Elevated high sensitivity troponin I (hsTnI) values and significant  changes across serial measurements may suggest ACS but many other  chronic and acute conditions are known to elevate hsTnI results.  Refer to the "Links" section for chest pain algorithms and additional  guidance. Performed at  [facility], [place] [address] [place], [place] [address]   Urinalysis, Routine w reflex microscopic Urine, Clean Catch     Status: Abnormal   Collection Time: 11/01/21 12:19 PM  Result Value Ref Range   Color, Urine STRAW (A) YELLOW   APPearance CLEAR CLEAR   Specific Gravity, Urine 1.013 1.005 - 1.030   pH 6.0 5.0 - 8.0   Glucose, UA NEGATIVE NEGATIVE mg/dL   Hgb urine dipstick NEGATIVE NEGATIVE   Bilirubin Urine NEGATIVE NEGATIVE   Ketones, ur NEGATIVE NEGATIVE mg/dL    Protein, ur NEGATIVE NEGATIVE mg/dL   Nitrite NEGATIVE NEGATIVE   Leukocytes,Ua NEGATIVE NEGATIVE    Comment: Performed at [facility] [address], [place], [place] [address]  Pregnancy, urine     Status: None   Collection Time: 11/01/21 12:19 PM  Result Value Ref Range   Preg Test, Ur NEGATIVE NEGATIVE    Comment:        THE SENSITIVITY OF THIS METHODOLOGY IS >20 mIU/mL. Performed at [facility], [place] [address], [place], [place] [address]       RADIOGRAPHY: DG Chest 2 View  Result Date: 11/01/2021 CLINICAL DATA:  Pain across her chest with shortness of breath for few months EXAM: CHEST - 2 VIEW COMPARISON:  None. FINDINGS: The cardiomediastinal silhouette is normal. There is no focal consolidation or pulmonary edema. There is no pleural effusion or pneumothorax. There is no acute osseous abnormality. IMPRESSION: No radiographic evidence of acute cardiopulmonary process. Electronically Signed   By: Valetta Mole M.D.   On: 11/01/2021 10:20       PATHOLOGY: None  ASSESSMENT/PLAN: 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  Iron studies are pending. We will have her donate with [facility] if needed.  Follow-up in 8 weeks.   All questions were answered. The patient knows to call the clinic with any problems, questions or concerns. We can certainly see the patient much sooner if necessary.   [name], NP

## 2021-11-03 LAB — IRON AND IRON BINDING CAPACITY (CC-WL,HP ONLY)
Iron: 94 ug/dL (ref 28–170)
Saturation Ratios: 35 % — ABNORMAL HIGH (ref 10.4–31.8)
TIBC: 269 ug/dL (ref 250–450)
UIBC: 175 ug/dL (ref 148–442)

## 2021-11-03 LAB — FERRITIN: Ferritin: 214 ng/mL (ref 11–307)

## 2021-11-04 ENCOUNTER — Encounter: Payer: Self-pay | Admitting: *Deleted

## 2021-11-04 ENCOUNTER — Telehealth: Payer: Self-pay | Admitting: *Deleted

## 2021-11-04 NOTE — Telephone Encounter (Signed)
Per 11/02/21 los - called and gave upcoming appointments

## 2021-11-05 DIAGNOSIS — Z20822 Contact with and (suspected) exposure to covid-19: Secondary | ICD-10-CM | POA: Diagnosis not present

## 2021-11-09 ENCOUNTER — Encounter: Payer: Self-pay | Admitting: Cardiology

## 2021-11-09 ENCOUNTER — Other Ambulatory Visit: Payer: Self-pay

## 2021-11-09 ENCOUNTER — Ambulatory Visit: Payer: 59 | Admitting: Cardiology

## 2021-11-09 DIAGNOSIS — R002 Palpitations: Secondary | ICD-10-CM | POA: Diagnosis not present

## 2021-11-09 DIAGNOSIS — R42 Dizziness and giddiness: Secondary | ICD-10-CM | POA: Insufficient documentation

## 2021-11-09 DIAGNOSIS — E063 Autoimmune thyroiditis: Secondary | ICD-10-CM

## 2021-11-09 NOTE — Progress Notes (Signed)
Cardiology Consultation:    Date:  11/09/2021   ID:  Angus Seller, DOB [birth year], MRN [id number]  PCP:  Mosie Lukes, MD  Cardiologist:  Jenne Campus, MD   Referring MD: Terrilyn Saver, NP   No chief complaint on file. I have palpitations and dizziness  History of Present Illness:    Amber Sweeney is a 41 y.o. female who is being seen today for the evaluation of palpitations and dizziness at the request of Terrilyn Saver, NP.  Past medical history significant for her to tolerate hemochromatosis, also history of Hashimoto thyroiditis, years ago she was diagnosed with palpitations apparently due to ventricular ectopy.  She did have echocardiogram done at that time she was told to have mild leak from one of the valve.  She was referred to [place] because of episode of dizziness.  She said those episodes happen anytime she could be sitting walking she will get momentarily dizzy she will start losing her vision started having muffled sounds she have to sit down or squat down in order to abort episode of syncope.  She never passed out the situation happen quite often she thinks maybe is getting slightly worse.  She also described to have some palpitations.  What you mean by that she would feel her heart flip-flopping skipping some there is no correlation between dizziness and palpitations.  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  She does not exercise on the regular basis.  She does have 3 children.  She tells me that her cholesterol is mildly elevated but not to the point that need to be treated. Does not have any family history of premature coronary artery disease  Past Medical History:  Diagnosis Date   Anxiety    Hashimoto's thyroiditis    Hemochromatosis     Past Surgical History:  Procedure Laterality Date   CHOLECYSTECTOMY     Ovaries and tubes removed     WISDOM TOOTH EXTRACTION      Current Medications: Current Meds  Medication Sig    Cholecalciferol 1.25 MG (50000 UT) capsule Take 1 capsule (50,000 Units) by mouth every 30 (thirty) days.   clonazePAM (KLONOPIN) 0.5 MG tablet TAKE 1 TO 2 TABLETS BY MOUTH TWICE DAILY FOR ANXIETY   FLUoxetine (PROZAC) 20 MG capsule TAKE 1 CAPSULE BY MOUTH EVERY DAY IN THE MORNING   thyroid (NP THYROID) 15 MG tablet TAKE 1 TABLET BY MOUTH FIRST THING IN THE MORNING ON AN EMPTY STOMACH   valACYclovir (VALTREX) 1000 MG tablet Take 1 tablet (1,000 mg total) by mouth 3 (three) times daily for 7 days     Allergies:   Celecoxib, Gabapentin, Iodinated contrast media, Levothyroxine sodium, Liraglutide -weight management, Pregabalin, Wellbutrin [bupropion], Cephalosporins, and Cyclosporine   Social History   Socioeconomic History   Marital status: Single    Spouse name: Not on file   Number of children: Not on file   Years of education: Not on file   Highest education level: Not on file  Occupational History   Not on file  Tobacco Use   Smoking status: Former    Types: Cigarettes    Quit date: 10/10/2018    Years since quitting: 3.0    Passive exposure: Past   Smokeless tobacco: Never   Tobacco comments:    Quit smoking 10/2018  Vaping Use   Vaping Use: Every day   Start date: 10/09/2018  Substance and Sexual Activity   Alcohol use:  Not Currently   Drug use: Not Currently   Sexual activity: Not on file  Other Topics Concern   Not on file  Social History Narrative   Not on file   Social Determinants of Health   Financial Resource Strain: Not on file  Food Insecurity: Not on file  Transportation Needs: Not on file  Physical Activity: Not on file  Stress: Not on file  Social Connections: Not on file     Family History: The patient's family history includes Anemia in her sister; BRCA 1/2 in her mother and sister; Breast cancer in her maternal aunt; Diabetes in her brother and father; Hashimoto's thyroiditis in her sister; Valvular heart disease in her mother. ROS:   Please see the  history of present illness.    All 14 point review of systems negative except as described per history of present illness.  EKGs/Labs/Other Studies Reviewed:    The following studies were reviewed today:   EKG:  EKG is  ordered today.  The ekg ordered today demonstrates normal sinus rhythm, normal P interval, normal QS complex duration fulgent nonspecific ST segment changes voltages normal  Recent Labs: 11/01/2021: ALT 17; BUN 15; Creatinine, Ser 0.92; Hemoglobin 12.4; Platelets 259; Potassium 4.3; Sodium 138; TSH 1.534  Recent Lipid Panel No results found for: CHOL, TRIG, HDL, CHOLHDL, VLDL, LDLCALC, LDLDIRECT  Physical Exam:    VS:  BP 98/68 (BP Location: Left Arm)    Pulse 86    Ht _0  (1.727 m)    Wt 175 lb (79.4 kg)    SpO2 99%    BMI 26.61 kg/m     Wt Readings from Last 3 Encounters:  11/09/21 175 lb (79.4 kg)  11/02/21 173 lb 6.4 oz (78.7 kg)  10/26/21 177 lb (80.3 kg)     GEN:  Well nourished, well developed in no acute distress HEENT: Normal NECK: No JVD; No carotid bruits LYMPHATICS: No lymphadenopathy CARDIAC: RRR, no murmurs, no rubs, no gallops RESPIRATORY:  Clear to auscultation without rales, wheezing or rhonchi  ABDOMEN: Soft, non-tender, non-distended MUSCULOSKELETAL:  No edema; No deformity  SKIN: Warm and dry NEUROLOGIC:  Alert and oriented x 3 PSYCHIATRIC:  Normal affect   ASSESSMENT:    1. Hereditary hemochromatosis ([address])   2. Dizziness   3. Palpitations   4. Hashimoto's thyroiditis    PLAN:    In order of problems listed above:  Dizziness.  I asked her to wear a monitor for 2 weeks to see if she got any significant arrhythmia I stressed importance of pressing the button when she feels some dizziness so we can correlate her symptoms with rhythm. Hemochromatosis.  We will schedule her to have echocardiogram to look up thickness of the wall. Palpitations monitor will be done history of Hashimoto thyroiditis.  Apparently now stable last TSH I see  is from 24th January which is 1.534 which is normal.   Medication Adjustments/Labs and Tests Ordered: Current medicines are reviewed at length with the patient today.  Concerns regarding medicines are outlined above.  No orders of the defined types were placed in this encounter.  No orders of the defined types were placed in this encounter.   Signed, Park Liter, MD, [place] 11/09/2021 [place]

## 2021-11-09 NOTE — Addendum Note (Signed)
Addended by: Roxanne Mins I on: 11/09/2021 04:23 PM   Modules accepted: Orders

## 2021-11-09 NOTE — Patient Instructions (Signed)
Medication Instructions:  °Your physician recommends that you continue on your current medications as directed. Please refer to the Current Medication list given to you today. ° °*If you need a refill on your cardiac medications before your next appointment, please call your pharmacy* ° ° °Lab Work: °None °If you have labs (blood work) drawn today and your tests are completely normal, you will receive your results only by: °MyChart Message (if you have MyChart) OR °A paper copy in the mail °If you have any lab test that is abnormal or we need to change your treatment, we will call you to review the results. ° ° °Testing/Procedures: °A zio monitor was ordered today. It will remain on for 14 days. You will then return monitor and event diary in provided box. It takes 1-2 weeks for report to be downloaded and returned to us. We will call you with the results. If monitor falls off or has orange flashing light, please call [employer] for further instructions.  ° °Your physician has requested that you have an echocardiogram. Echocardiography is a painless test that uses sound waves to create images of your heart. It provides your doctor with information about the size and shape of your heart and how well your heart’s chambers and valves are working. This procedure takes approximately one hour. There are no restrictions for this procedure. ° ° ° °Follow-Up: °At [facility], you and your health needs are our priority.  As part of our continuing mission to provide you with exceptional heart care, we have created designated Provider Care Teams.  These Care Teams include your primary Cardiologist (physician) and Advanced Practice Providers (APPs -  Physician Assistants and Nurse Practitioners) who all work together to provide you with the care you need, when you need it. ° °We recommend signing up for the patient portal called "MyChart".  Sign up information is provided on this After Visit Summary.  MyChart is used to connect with  patients for Virtual Visits (Telemedicine).  Patients are able to view lab/test results, encounter notes, upcoming appointments, etc.  Non-urgent messages can be sent to your provider as well.   °To learn more about what you can do with MyChart, go to [email or web address].com.   ° °Your next appointment:   °2 month(s) ° °The format for your next appointment:   °In Person ° °Provider:   °Robert Krasowski, MD  ° ° °Other Instructions °None  °

## 2021-11-11 ENCOUNTER — Ambulatory Visit (INDEPENDENT_AMBULATORY_CARE_PROVIDER_SITE_OTHER): Payer: 59

## 2021-11-11 DIAGNOSIS — R002 Palpitations: Secondary | ICD-10-CM

## 2021-11-11 DIAGNOSIS — E063 Autoimmune thyroiditis: Secondary | ICD-10-CM | POA: Diagnosis not present

## 2021-11-11 DIAGNOSIS — R42 Dizziness and giddiness: Secondary | ICD-10-CM

## 2021-11-12 ENCOUNTER — Encounter: Payer: Self-pay | Admitting: Family

## 2021-11-14 ENCOUNTER — Ambulatory Visit: Payer: 59 | Admitting: Internal Medicine

## 2021-11-14 ENCOUNTER — Encounter: Payer: Self-pay | Admitting: Internal Medicine

## 2021-11-14 ENCOUNTER — Other Ambulatory Visit: Payer: Self-pay | Admitting: Family

## 2021-11-14 ENCOUNTER — Other Ambulatory Visit: Payer: Self-pay

## 2021-11-14 DIAGNOSIS — L237 Allergic contact dermatitis due to plants, except food: Secondary | ICD-10-CM

## 2021-11-14 MED ORDER — METHYLPREDNISOLONE ACETATE 80 MG/ML IJ SUSP
80.0000 mg | Freq: Once | INTRAMUSCULAR | Status: AC
  Administered 2021-11-14: 80 mg via INTRAMUSCULAR

## 2021-11-14 NOTE — Patient Instructions (Signed)
Depomedrol 80 mg given IM right buttock. May apply calamine lotion topically if desired.

## 2021-11-14 NOTE — Progress Notes (Signed)
° °  Subjective:    Patient ID: Amber Sweeney, female    DOB: September 28, 1981, 41 y.o.   MRN: [id number]  HPI  Pleasant 41 year old Female was cleaning up a yard this past weekend and has developed  lesion of erythema and itching behind right ear, right face and 2 lesions on either side of abdomen that are red and itchy. Hx of reactivity to poison ivy in the past.  Currently being evaluated for palpitations by Cardiology. Wearing a Zio patch for 14 days.  Hx of hemochromatosis currently treated with blood donation.     Review of Systems see above.     Objective:   Physical Exam Afebrile Has semicircular patch of erythema with tiny papules right postauricular area. Has linear erythematous lesion about 1 cm in length on right face. Has 2 small discrete lesions non-papular each on either side of abdomen without drainage. Eyes are OK.       Assessment & Plan:   Poison Ivy Dermatitis Plan: Depomedrol 80 mg IM right buttock today. May apply topical calamine lotion if she would like.

## 2021-11-15 ENCOUNTER — Other Ambulatory Visit: Payer: Self-pay | Admitting: *Deleted

## 2021-11-16 ENCOUNTER — Inpatient Hospital Stay: Payer: 59 | Attending: Hematology & Oncology

## 2021-11-16 ENCOUNTER — Other Ambulatory Visit: Payer: Self-pay

## 2021-11-16 LAB — CBC WITH DIFFERENTIAL (CANCER CENTER ONLY)
Abs Immature Granulocytes: 0.05 10*3/uL (ref 0.00–0.07)
Basophils Absolute: 0.1 10*3/uL (ref 0.0–0.1)
Basophils Relative: 1 %
Eosinophils Absolute: 0.1 10*3/uL (ref 0.0–0.5)
Eosinophils Relative: 2 %
HCT: 36.1 % (ref 36.0–46.0)
Hemoglobin: 12.2 g/dL (ref 12.0–15.0)
Immature Granulocytes: 1 %
Lymphocytes Relative: 35 %
Lymphs Abs: 2.2 10*3/uL (ref 0.7–4.0)
MCH: 32.8 pg (ref 26.0–34.0)
MCHC: 33.8 g/dL (ref 30.0–36.0)
MCV: 97 fL (ref 80.0–100.0)
Monocytes Absolute: 0.3 10*3/uL (ref 0.1–1.0)
Monocytes Relative: 4 %
Neutro Abs: 3.6 10*3/uL (ref 1.7–7.7)
Neutrophils Relative %: 57 %
Platelet Count: 231 10*3/uL (ref 150–400)
RBC: 3.72 MIL/uL — ABNORMAL LOW (ref 3.87–5.11)
RDW: 13 % (ref 11.5–15.5)
WBC Count: 6.3 10*3/uL (ref 4.0–10.5)
nRBC: 0 % (ref 0.0–0.2)

## 2021-11-16 LAB — CMP (CANCER CENTER ONLY)
ALT: 15 U/L (ref 0–44)
AST: 14 U/L — ABNORMAL LOW (ref 15–41)
Albumin: 4.3 g/dL (ref 3.5–5.0)
Alkaline Phosphatase: 45 U/L (ref 38–126)
Anion gap: 7 (ref 5–15)
BUN: 18 mg/dL (ref 6–20)
CO2: 29 mmol/L (ref 22–32)
Calcium: 9 mg/dL (ref 8.9–10.3)
Chloride: 105 mmol/L (ref 98–111)
Creatinine: 1.06 mg/dL — ABNORMAL HIGH (ref 0.44–1.00)
GFR, Estimated: >60 mL/min (ref >60)
Glucose, Bld: 96 mg/dL (ref 70–99)
Potassium: 3.9 mmol/L (ref 3.5–5.1)
Sodium: 141 mmol/L (ref 135–145)
Total Bilirubin: 0.4 mg/dL (ref 0.3–1.2)
Total Protein: 6.4 g/dL — ABNORMAL LOW (ref 6.5–8.1)

## 2021-11-16 LAB — SAMPLE TO BLOOD BANK

## 2021-11-17 LAB — IRON AND IRON BINDING CAPACITY (CC-WL,HP ONLY)
Iron: 98 ug/dL (ref 28–170)
Saturation Ratios: 35 % — ABNORMAL HIGH (ref 10.4–31.8)
TIBC: 277 ug/dL (ref 250–450)
UIBC: 179 ug/dL (ref 148–442)

## 2021-11-17 LAB — FERRITIN: Ferritin: 182 ng/mL (ref 11–307)

## 2021-11-18 ENCOUNTER — Telehealth: Payer: Self-pay | Admitting: *Deleted

## 2021-11-18 NOTE — Telephone Encounter (Signed)
This nurse called and reviewed her CBC and iron studies with her. Patient verbalized understanding. Patient stated,"I don't want to go back to [facility] because they won't let me donate because they told me my HGB was low. They don't draw blood or do a fingerstick; they place a plastic device over my arm and that tells them my HGB. I would rather come to your office and donate there." I told her I would send a message to scheduling and they would call her and arrange a date and time. I also told her that Maralyn Sago, NP, was out of the office this past Thursday and Friday, but she will be back Monday if she had any more questions or concerns about labs or scheduling. She verbalized understanding. LOS sent to scheduling.

## 2021-11-23 ENCOUNTER — Ambulatory Visit (HOSPITAL_BASED_OUTPATIENT_CLINIC_OR_DEPARTMENT_OTHER): Payer: 59

## 2021-11-25 ENCOUNTER — Other Ambulatory Visit (HOSPITAL_COMMUNITY): Payer: Self-pay

## 2021-11-30 ENCOUNTER — Ambulatory Visit (HOSPITAL_COMMUNITY): Payer: Self-pay | Admitting: Psychiatry

## 2021-11-30 ENCOUNTER — Other Ambulatory Visit: Payer: Self-pay | Admitting: Family

## 2021-11-30 ENCOUNTER — Inpatient Hospital Stay: Payer: 59

## 2021-11-30 ENCOUNTER — Other Ambulatory Visit: Payer: Self-pay

## 2021-11-30 NOTE — Progress Notes (Signed)
Angus Seller presents today for phlebotomy per MD orders. Phlebotomy procedure started at 1505 and ended at 1510 via 20 ga angiocath to left ac by Dorina Hoyer RN, 48 grams removed.until site clotted. Second attempt to left ac via phlebotomy kit unsuccessful per Dorina Hoyer RN.  Third attempt to right ac with 20 ga angiocath per Meade Maw RN with 116 grams removed until site clotted. Pt refused to be stuck again and states that she will f/u with [facility].

## 2021-12-02 DIAGNOSIS — R42 Dizziness and giddiness: Secondary | ICD-10-CM | POA: Diagnosis not present

## 2021-12-02 DIAGNOSIS — R002 Palpitations: Secondary | ICD-10-CM | POA: Diagnosis not present

## 2021-12-25 ENCOUNTER — Telehealth: Payer: 59 | Admitting: Family

## 2021-12-25 DIAGNOSIS — B029 Zoster without complications: Secondary | ICD-10-CM | POA: Diagnosis not present

## 2021-12-25 MED ORDER — VALACYCLOVIR HCL 1 G PO TABS: 1000.0000 mg | ORAL_TABLET | Freq: Three times a day (TID) | ORAL | 2 refills | Status: DC

## 2021-12-25 NOTE — Patient Instructions (Signed)
Shingles ?Shingles, which is also known as herpes zoster, is an infection that causes a painful skin rash and fluid-filled blisters. It is caused by a virus. ?Shingles only develops in people who: ?Have had chickenpox. ?Have been vaccinated against chickenpox. Shingles is rare in this group. ?What are the causes? ?Shingles is caused by varicella-zoster virus. This is the same virus that causes chickenpox. After a person is exposed to the virus, it stays in the body in an inactive (dormant) state. Shingles develops if the virus is reactivated. This can happen many years after the first (initial) exposure to the virus. It is not known what causes this virus to be reactivated. ?What increases the risk? ?People who have had chickenpox or received the chickenpox vaccine are at risk for shingles. Shingles infection is more common in people who: ?Are older than 42 years of age. ?Have a weakened disease-fighting system (immune system), such as people with: ?HIV (human immunodeficiency virus). ?AIDS (acquired immunodeficiency syndrome). ?Cancer. ?Are taking medicines that weaken the immune system, such as organ transplant medicines. ?Are experiencing a lot of stress. ?What are the signs or symptoms? ?Early symptoms of this condition include itching, tingling, and pain in an area on your skin. Pain may be described as burning, stabbing, or throbbing. ?A few days or weeks after early symptoms start, a painful red rash appears. The rash is usually on one side of the body and has a band-like or belt-like pattern. The rash eventually turns into fluid-filled blisters that break open, change into scabs, and dry up in about 2-3 weeks. ?At any time during the infection, you may also develop: ?A fever. ?Chills. ?A headache. ?Nausea. ?How is this diagnosed? ?This condition is diagnosed with a skin exam. Skin or fluid samples (a culture) may be taken from the blisters before a diagnosis is made. ?How is this treated? ?The rash may last  for several weeks. There is not a specific cure for this condition. Your health care provider may prescribe medicines to help you manage pain, recover more quickly, and avoid long-term problems. Medicines may include: ?Antiviral medicines. ?Anti-inflammatory medicines. ?Pain medicines. ?Anti-itching medicines (antihistamines). ?If the area involved is on your face, you may be referred to a specialist, such as an eye doctor (ophthalmologist) or an ear, nose, and throat (ENT) doctor (otorhinolaryngologist) to help you avoid eye problems, chronic pain, or disability. ?Follow these instructions at home: ?Medicines ?Take over-the-counter and prescription medicines only as told by your health care provider. ?Apply an anti-itch cream or numbing cream to the affected area as told by your health care provider. ?Relieving itching and discomfort ? ?Apply cold, wet cloths (cold compresses) to the area of the rash or blisters as told by your health care provider. ?Cool baths can be soothing. Try adding baking soda or dry oatmeal to the water to reduce itching. Do not bathe in hot water. ?Use calamine lotion as recommended by your health care provider. This is an over-the-counter lotion that helps to relieve itchiness. ?Blister and rash care ?Keep your rash covered with a loose bandage (dressing). Wear loose-fitting clothing to help ease the pain of material rubbing against the rash. ?Wash your hands with soap and water for at least 20 seconds before and after you change your dressing. If soap and water are not available, use hand sanitizer. ?Change your dressing as told by your health care provider. ?Keep your rash and blisters clean by washing the area with mild soap and cool water as told by your health   care provider. ?Check your rash every day for signs of infection. Check for: ?More redness, swelling, or pain. ?Fluid or blood. ?Warmth. ?Pus or a bad smell. ?Do not scratch your rash or pick at your blisters. To help avoid  scratching: ?Keep your fingernails clean and cut short. ?Wear gloves or mittens while you sleep, if scratching is a problem. ?General instructions ?Rest as told by your health care provider. ?Wash your hands often with soap and water for at least 20 seconds. If soap and water are not available, use hand sanitizer. Doing this lowers your chance of getting a bacterial skin infection. ?Before your blisters change into scabs, your shingles infection can cause chickenpox in people who have never had it or have never been vaccinated against it. To prevent this from happening, avoid contact with other people, especially: ?Babies. ?Pregnant women. ?Children who have eczema. ?Older people who have transplants. ?People who have chronic illnesses, such as cancer or AIDS. ?Keep all follow-up visits. This is important. ?How is this prevented? ?Getting vaccinated is the best way to prevent shingles and protect against shingles complications. If you have not been vaccinated, talk with your health care provider about getting the vaccine. ?Where to find more information ?Centers for Disease Control and Prevention: www.cdc.gov ?Contact a health care provider if: ?Your pain is not relieved with prescribed medicines. ?Your pain does not get better after the rash heals. ?You have any of these signs of infection: ?More redness, swelling, or pain around the rash. ?Fluid or blood coming from the rash. ?Warmth coming from your rash. ?Pus or a bad smell coming from the rash. ?A fever. ?Get help right away if: ?The rash is on your face or nose. ?You have facial pain, pain around your eye area, or loss of feeling on one side of your face. ?You have difficulty seeing. ?You have ear pain or have ringing in your ear. ?You have a loss of taste. ?Your condition gets worse. ?Summary ?Shingles, also known as herpes zoster, is an infection that causes a painful skin rash and fluid-filled blisters. ?This condition is diagnosed with a skin exam. Skin or  fluid samples (a culture) may be taken from the blisters. ?Keep your rash covered with a loose bandage (dressing). Wear loose-fitting clothing to help ease the pain of material rubbing against the rash. ?Before your blisters change into scabs, your shingles infection can cause chickenpox in people who have never had it or have never been vaccinated against it. ?This information is not intended to replace advice given to you by your health care provider. Make sure you discuss any questions you have with your health care provider. ?Document Revised: 09/20/2020 Document Reviewed: 09/20/2020 ?Elsevier Patient Education ? 2022 [employer] ? ?

## 2021-12-25 NOTE — Progress Notes (Signed)
?Virtual Visit Consent  ? ?Amber Sweeney, you are scheduled for a virtual visit with a [facility] provider today.   ?  ?Just as with appointments in the office, your consent must be obtained to participate.  Your consent will be active for this visit and any virtual visit you may have with one of our providers in the next 365 days.   ?  ?If you have a MyChart account, a copy of this consent can be sent to you electronically.  All virtual visits are billed to your insurance company just like a traditional visit in the office.   ? ?As this is a virtual visit, video technology does not allow for your provider to perform a traditional examination.  This may limit your provider's ability to fully assess your condition.  If your provider identifies any concerns that need to be evaluated in person or the need to arrange testing (such as labs, EKG, etc.), we will make arrangements to do so.   ?  ?Although advances in technology are sophisticated, we cannot ensure that it will always work on either your end or our end.  If the connection with a video visit is poor, the visit may have to be switched to a telephone visit.  With either a video or telephone visit, we are not always able to ensure that we have a secure connection.    ? ?I need to obtain your verbal consent now.   Are you willing to proceed with your visit today?  ?  ?Amber Sweeney has provided verbal consent on 12/25/2021 for a virtual visit (video or telephone). ?  ?[name], FNP  ? ?Date: 12/25/2021 11:36 AM ? ? ?Virtual Visit via Video Note  ? Amber Sweeney, connected with  XITLALLY MOONEYHAM  ([id number], 1981/03/22) on 12/25/21 at 11:30 AM EDT by a video-enabled telemedicine application and verified that I am speaking with the correct person using two identifiers. ? ?Location: ?Patient: Virtual Visit Location Patient: Home ?Provider: Virtual Visit Location Provider: Home Office ?  ?I discussed the limitations of evaluation and management by  telemedicine and the availability of in person appointments. The patient expressed understanding and agreed to proceed.   ? ?History of Present Illness: ?Amber Sweeney is a 41 y.o. who identifies as a female who was assigned female at birth, and is being seen today for left neck pain that radiates to her ear and back of her head that started this morning. She noticed increase swelling in her lymph node. She reports she tenderness to her skin of a 5 out 10. States the pain is worse on the back of her scalp and does not want to touch her scalp. Denies any rash or fever.  ? ?HPI: HPI  ?Problems:  ?Patient Active Problem List  ? Diagnosis Date Noted  ? Dizziness 11/09/2021  ? Palpitations 11/09/2021  ? 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 Hashimoto's thyroiditis 10/26/2021  ? Anxiety 10/26/2021  ?  ?Allergies:  ?Allergies  ?Allergen Reactions  ? Celecoxib Other (See Comments)  ?  Patient stated it gave her pancreatitis  ? Gabapentin Palpitations  ? Iodinated Contrast Media Other (See Comments)  ?  Panic Attack  ? Levothyroxine Sodium Palpitations  ? Liraglutide -Weight Management Other (See Comments)  ?  Injection site turned black for months  ? Pregabalin Other (See Comments)  ?  Panic attack ? ?  ? Wellbutrin [Bupropion] Other (See Comments)  ?  "I didn't care about anything"  ?  Cephalosporins Rash  ? Cyclosporine Hives  ? ?Medications:  ?Current Outpatient Medications:  ?  Cholecalciferol 1.25 MG (50000 UT) capsule, Take 1 capsule by mouth every 30 (thirty) days., Disp: 3 capsule, Rfl: 1 ?  clonazePAM (KLONOPIN) 0.5 MG tablet, TAKE 1 TO 2 TABLETS BY MOUTH TWICE DAILY FOR ANXIETY, Disp: 120 tablet, Rfl: 0 ?  FLUoxetine (PROZAC) 20 MG capsule, TAKE 1 CAPSULE BY MOUTH EVERY DAY IN THE MORNING, Disp: 90 capsule, Rfl: 1 ?  Semaglutide-Weight Management (WEGOVY) 2.4 MG/0.75ML SOAJ, Inject 2.4MG  into the skin every week on the same day (Patient not taking: Reported on 11/09/2021), Disp: 12 mL, Rfl: 1 ?   Semaglutide-Weight Management (WEGOVY) 2.4 MG/0.75ML SOAJ, Inject (2.4MG ) into the skin every week on the same day of each week (Patient not taking: Reported on 11/09/2021), Disp: 12 mL, Rfl: 1 ?  thyroid (NP THYROID) 15 MG tablet, TAKE 1 TABLET BY MOUTH FIRST THING IN THE MORNING ON AN EMPTY STOMACH, Disp: 90 tablet, Rfl: 0 ?  valACYclovir (VALTREX) 1000 MG tablet, Take 1 tablet by mouth 3 (three) times daily for 7 days, Disp: 21 tablet, Rfl: 2 ? ?Observations/Objective: ?Patient is well-developed, well-nourished in no acute distress.  ?Resting comfortably  at home.  ?Head is normocephalic, atraumatic.  ?No labored breathing.  ?Speech is clear and coherent with logical content.  ?Patient is alert and oriented at baseline.  ?No rash noted ?Pain with palpation on left scalp ? ?Assessment and Plan: ?1. Herpes zoster without complication ?- valACYclovir (VALTREX) 1000 MG tablet; Take 1 tablet by mouth 3 (three) times daily for 7 days  Dispense: 21 tablet; Refill: 2 ? ?Tylenol as needed ?Can not tolerate gabapentin ?Avoid immune compromised, pregnant women, and elderly  ?Rest ?Follow up if symptoms worsen or do not improve  ? ?Follow Up Instructions: ?I discussed the assessment and treatment plan with the patient. The patient was provided an opportunity to ask questions and all were answered. The patient agreed with the plan and demonstrated an understanding of the instructions.  A copy of instructions were sent to the patient via MyChart unless otherwise noted below.  ? ? ? ?The patient was advised to call back or seek an in-person evaluation if the symptoms worsen or if the condition fails to improve as anticipated. ? ?Time:  ?I spent 14 minutes with the patient via telehealth technology discussing the above problems/concerns.   ? ?[name], FNP ? ?

## 2021-12-28 ENCOUNTER — Inpatient Hospital Stay: Payer: 59 | Attending: Hematology & Oncology

## 2021-12-28 ENCOUNTER — Encounter: Payer: Self-pay | Admitting: Family

## 2021-12-28 ENCOUNTER — Inpatient Hospital Stay: Payer: 59 | Admitting: Family

## 2021-12-28 ENCOUNTER — Other Ambulatory Visit: Payer: Self-pay

## 2021-12-28 ENCOUNTER — Other Ambulatory Visit: Payer: Self-pay | Admitting: Family

## 2021-12-28 ENCOUNTER — Telehealth: Payer: Self-pay | Admitting: *Deleted

## 2021-12-28 DIAGNOSIS — R1084 Generalized abdominal pain: Secondary | ICD-10-CM

## 2021-12-28 LAB — CMP (CANCER CENTER ONLY)
ALT: 14 U/L (ref 0–44)
AST: 13 U/L — ABNORMAL LOW (ref 15–41)
Albumin: 4.4 g/dL (ref 3.5–5.0)
Alkaline Phosphatase: 48 U/L (ref 38–126)
Anion gap: 8 (ref 5–15)
BUN: 17 mg/dL (ref 6–20)
CO2: 28 mmol/L (ref 22–32)
Calcium: 9.3 mg/dL (ref 8.9–10.3)
Chloride: 105 mmol/L (ref 98–111)
Creatinine: 0.77 mg/dL (ref 0.44–1.00)
GFR, Estimated: >60 mL/min
Glucose, Bld: 86 mg/dL (ref 70–99)
Potassium: 3.9 mmol/L (ref 3.5–5.1)
Sodium: 141 mmol/L (ref 135–145)
Total Bilirubin: 0.3 mg/dL (ref 0.3–1.2)
Total Protein: 6.7 g/dL (ref 6.5–8.1)

## 2021-12-28 LAB — CBC WITH DIFFERENTIAL (CANCER CENTER ONLY)
Abs Immature Granulocytes: 0.01 K/uL (ref 0.00–0.07)
Basophils Absolute: 0.1 K/uL (ref 0.0–0.1)
Basophils Relative: 1 %
Eosinophils Absolute: 0.3 K/uL (ref 0.0–0.5)
Eosinophils Relative: 4 %
HCT: 37 % (ref 36.0–46.0)
Hemoglobin: 12.7 g/dL (ref 12.0–15.0)
Immature Granulocytes: 0 %
Lymphocytes Relative: 37 %
Lymphs Abs: 2.4 K/uL (ref 0.7–4.0)
MCH: 33.1 pg (ref 26.0–34.0)
MCHC: 34.3 g/dL (ref 30.0–36.0)
MCV: 96.4 fL (ref 80.0–100.0)
Monocytes Absolute: 0.4 K/uL (ref 0.1–1.0)
Monocytes Relative: 5 %
Neutro Abs: 3.4 K/uL (ref 1.7–7.7)
Neutrophils Relative %: 53 %
Platelet Count: 245 K/uL (ref 150–400)
RBC: 3.84 MIL/uL — ABNORMAL LOW (ref 3.87–5.11)
RDW: 12.2 % (ref 11.5–15.5)
WBC Count: 6.5 K/uL (ref 4.0–10.5)
nRBC: 0 % (ref 0.0–0.2)

## 2021-12-28 NOTE — Progress Notes (Signed)
?Hematology and Oncology Follow Up Visit ? ?MERRILL DEANDA ?[id number] ?Jan 27, 1981 41 y.o. ?12/28/2021 ? ? 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 ?Current Therapy:   ?Phlebotomy to maintain iron saturation < 50% and ferritin < 100 ?  ?Interim History:  Ms. Omura is here today for follow-up. She is doing fairly well but still notes fatigue and headaches.  ?[facility]. She did not have a good experience with [facility] and her phlebotomy kept clotting off here in infusion.  ?Iron studies today are pending.  ?She has some episodes of palpitations that come and go.  ?No fever, chills, n/v, cough, rash, dizziness, SOB, chest pain, abdominal pain or changes in bowel or bladder habits.  ?No swelling, tenderness, numbness or tingling in her extremities.  ?No falls or syncope to report.  ?Appetite is ok and she is doing her best to stay well hydrated throughout the day. Her weight is stable at 177 lbs.  ? ?ECOG Performance Status: 1 - Symptomatic but completely ambulatory ? ?Medications:  ?Allergies as of 12/28/2021   ? ?   Reactions  ? Celecoxib Other (See Comments)  ? Patient stated it gave her pancreatitis  ? Gabapentin Palpitations  ? Iodinated Contrast Media Other (See Comments)  ? Panic Attack  ? Levothyroxine Sodium Palpitations  ? Liraglutide -weight Management Other (See Comments)  ? Injection site turned black for months  ? Pregabalin Other (See Comments)  ? Panic attack  ? Wellbutrin [bupropion] Other (See Comments)  ? "I didn't care about anything"  ? Cephalosporins Rash  ? Cyclosporine Hives  ? ?  ? ?  ?Medication List  ?  ? ?  ? Accurate as of December 28, 2021  1:08 PM. If you have any questions, ask your nurse or doctor.  ?  ?  ? ?  ? ?clonazePAM 0.5 MG tablet ?Commonly known as: KLONOPIN ?TAKE 1 TO 2 TABLETS BY MOUTH TWICE DAILY FOR ANXIETY ?  ?FLUoxetine 20 MG capsule ?Commonly known as: PROzac ?TAKE 1 CAPSULE BY MOUTH EVERY DAY  IN THE MORNING ?  ?NP Thyroid 15 MG tablet ?Generic drug: thyroid ?TAKE 1 TABLET BY MOUTH FIRST THING IN THE MORNING ON AN EMPTY STOMACH ?  ?valACYclovir 1000 MG tablet ?Commonly known as: Valtrex ?Take 1 tablet by mouth 3 (three) times daily for 7 days ?  ?Vitamin D3 1.25 MG (50000 UT) Caps ?Take 1 capsule by mouth every 30 (thirty) days. ?  ?Wegovy 2.4 MG/0.75ML Soaj ?Generic drug: Semaglutide-Weight Management ?Inject 2.4MG  into the skin every week on the same day ?  ?Wegovy 2.4 MG/0.75ML Soaj ?Generic drug: Semaglutide-Weight Management ?Inject (2.4MG ) into the skin every week on the same day of each week ?  ? ?  ? ? ?Allergies:  ?Allergies  ?Allergen Reactions  ? Celecoxib Other (See Comments)  ?  Patient stated it gave her pancreatitis  ? Gabapentin Palpitations  ? Iodinated Contrast Media Other (See Comments)  ?  Panic Attack  ? Levothyroxine Sodium Palpitations  ? Liraglutide -Weight Management Other (See Comments)  ?  Injection site turned black for months  ? Pregabalin Other (See Comments)  ?  Panic attack ? ?  ? Wellbutrin [Bupropion] Other (See Comments)  ?  "I didn't care about anything"  ? Cephalosporins Rash  ? Cyclosporine Hives  ? ? ?Past Medical History, Surgical history, Social history, and Family History were reviewed and updated. ? ?Review of Systems: ?All  other 10 point review of systems is negative.  ? ?Physical Exam: ? vitals were not taken for this visit.  ? ?Wt Readings from Last 3 Encounters:  ?11/09/21 175 lb (79.4 kg)  ?11/02/21 173 lb 6.4 oz (78.7 kg)  ?10/26/21 177 lb (80.3 kg)  ? ? ?Ocular: Sclerae unicteric, pupils equal, round and reactive to light ?Ear-nose-throat: Oropharynx clear, dentition fair ?Lymphatic: No cervical or supraclavicular adenopathy ?Lungs no rales or rhonchi, good excursion bilaterally ?Heart regular rate and rhythm, no murmur appreciated ?Abd soft, nontender, positive bowel sounds ?MSK no focal spinal tenderness, no joint edema ?Neuro: non-focal, well-oriented,  appropriate affect ?Breasts: Deferred  ? ?Lab Results  ?Component Value Date  ? WBC 6.3 11/16/2021  ? HGB 12.2 11/16/2021  ? HCT 36.1 11/16/2021  ? MCV 97.0 11/16/2021  ? PLT 231 11/16/2021  ? ?Lab Results  ?Component Value Date  ? FERRITIN 182 11/16/2021  ? IRON 98 11/16/2021  ? TIBC 277 11/16/2021  ? UIBC 179 11/16/2021  ? IRONPCTSAT 35 (H) 11/16/2021  ? ?Lab Results  ?Component Value Date  ? RBC 3.72 (L) 11/16/2021  ? ?No results found for: KPAFRELGTCHN, LAMBDASER, KAPLAMBRATIO ?No results found for: IGGSERUM, IGA, IGMSERUM ?No results found for: TOTALPROTELP, ALBUMINELP, A1GS, A2GS, BETS, BETA2SER, GAMS, MSPIKE, SPEI ?  Chemistry   ?   ?Component Value Date/Time  ? NA 141 11/16/2021 1403  ? K 3.9 11/16/2021 1403  ? CL 105 11/16/2021 1403  ? CO2 29 11/16/2021 1403  ? BUN 18 11/16/2021 1403  ? CREATININE 1.06 (H) 11/16/2021 1403  ?    ?Component Value Date/Time  ? CALCIUM 9.0 11/16/2021 1403  ? ALKPHOS 45 11/16/2021 1403  ? AST 14 (L) 11/16/2021 1403  ? ALT 15 11/16/2021 1403  ? BILITOT 0.4 11/16/2021 1403  ?  ? ? ? ?Impression and Plan: Ms. Ashley Royalty is a very pleasant 41 yo caucasian female with history of hemochromatosis, homozygous for the C282Y mutation.  ?Iron studies pending. We will get her set up for extra phlebotomies if needed.  ?Lab check in 6 weeks, follow-up in 3 months.  ? ?Eileen Stanford, NP ?3/22/20231:08 PM ? ?

## 2021-12-28 NOTE — Telephone Encounter (Signed)
Per 12/28/21 los - gave upcoming appointments - confirmed ?

## 2021-12-29 LAB — FERRITIN: Ferritin: 129 ng/mL (ref 11–307)

## 2021-12-29 LAB — IRON AND IRON BINDING CAPACITY (CC-WL,HP ONLY)
Iron: 81 ug/dL (ref 28–170)
Saturation Ratios: 31 % (ref 10.4–31.8)
TIBC: 258 ug/dL (ref 250–450)
UIBC: 177 ug/dL (ref 148–442)

## 2022-01-02 ENCOUNTER — Telehealth: Payer: Self-pay

## 2022-01-02 NOTE — Telephone Encounter (Signed)
Advised via [employer]. ?

## 2022-01-02 NOTE — Telephone Encounter (Signed)
-----   Message from Erenest Blank, NP sent at 01/02/2022  9:40 AM EDT ----- ?She needs one phlebotomy. HYDRATE!!!!! ? ? ?----- Message ----- ?From: [facility], [facility] [place] ?Sent: 12/28/2021   1:09 PM EDT ?To: Erenest Blank, NP ? ? ?

## 2022-01-03 ENCOUNTER — Telehealth: Payer: Self-pay | Admitting: Family

## 2022-01-03 NOTE — Telephone Encounter (Signed)
Called to schedule phlebotomy per 3/27 sch msg , [facility]  ?

## 2022-01-04 ENCOUNTER — Other Ambulatory Visit (HOSPITAL_COMMUNITY): Payer: Self-pay

## 2022-01-09 ENCOUNTER — Encounter: Payer: Self-pay | Admitting: Family Medicine

## 2022-01-10 MED ORDER — GENABIO COVID-19 RAPID TEST VI KIT: PACK | 2 refills | Status: DC

## 2022-01-18 ENCOUNTER — Ambulatory Visit (HOSPITAL_BASED_OUTPATIENT_CLINIC_OR_DEPARTMENT_OTHER): Admission: RE | Admit: 2022-01-18 | Payer: 59 | Source: Ambulatory Visit

## 2022-01-18 DIAGNOSIS — E663 Overweight: Secondary | ICD-10-CM | POA: Diagnosis not present

## 2022-01-18 DIAGNOSIS — E063 Autoimmune thyroiditis: Secondary | ICD-10-CM | POA: Diagnosis not present

## 2022-01-18 DIAGNOSIS — F411 Generalized anxiety disorder: Secondary | ICD-10-CM | POA: Diagnosis not present

## 2022-01-18 DIAGNOSIS — Z1331 Encounter for screening for depression: Secondary | ICD-10-CM | POA: Diagnosis not present

## 2022-01-25 ENCOUNTER — Ambulatory Visit (INDEPENDENT_AMBULATORY_CARE_PROVIDER_SITE_OTHER): Payer: 59 | Admitting: Family Medicine

## 2022-01-25 ENCOUNTER — Encounter: Payer: Self-pay | Admitting: Family Medicine

## 2022-01-25 ENCOUNTER — Other Ambulatory Visit (HOSPITAL_COMMUNITY)
Admission: RE | Admit: 2022-01-25 | Discharge: 2022-01-25 | Disposition: A | Discharge status: Home / Self Care | Payer: 59 | Source: Ambulatory Visit | Attending: Family Medicine | Admitting: Family Medicine

## 2022-01-25 VITALS — BP 98/63 | HR 77 | Ht 68.0 in | Wt 177.6 lb

## 2022-01-25 DIAGNOSIS — Z0000 Encounter for general adult medical examination without abnormal findings: Secondary | ICD-10-CM | POA: Diagnosis not present

## 2022-01-25 DIAGNOSIS — Z1159 Encounter for screening for other viral diseases: Secondary | ICD-10-CM | POA: Diagnosis not present

## 2022-01-25 DIAGNOSIS — Z124 Encounter for screening for malignant neoplasm of cervix: Secondary | ICD-10-CM | POA: Insufficient documentation

## 2022-01-25 DIAGNOSIS — Z114 Encounter for screening for human immunodeficiency virus [HIV]: Secondary | ICD-10-CM | POA: Diagnosis not present

## 2022-01-25 LAB — LIPID PANEL
Cholesterol: 171 mg/dL (ref 0–200)
HDL: 47 mg/dL (ref >39.00)
LDL Cholesterol: 100 mg/dL — ABNORMAL HIGH (ref 0–99)
NonHDL: 123.77
Total CHOL/HDL Ratio: 4
Triglycerides: 119 mg/dL (ref 0.0–149.0)
VLDL: 23.8 mg/dL (ref 0.0–40.0)

## 2022-01-25 LAB — COMPREHENSIVE METABOLIC PANEL
ALT: 28 U/L (ref 0–35)
AST: 21 U/L (ref 0–37)
Albumin: 4.3 g/dL (ref 3.5–5.2)
Alkaline Phosphatase: 45 U/L (ref 39–117)
BUN: 17 mg/dL (ref 6–23)
CO2: 28 mEq/L (ref 19–32)
Calcium: 8.9 mg/dL (ref 8.4–10.5)
Chloride: 105 mEq/L (ref 96–112)
Creatinine, Ser: 0.77 mg/dL (ref 0.40–1.20)
GFR: 95.85 mL/min (ref >60.00)
Glucose, Bld: 81 mg/dL (ref 70–99)
Potassium: 4.3 mEq/L (ref 3.5–5.1)
Sodium: 140 mEq/L (ref 135–145)
Total Bilirubin: 0.3 mg/dL (ref 0.2–1.2)
Total Protein: 6.4 g/dL (ref 6.0–8.3)

## 2022-01-25 LAB — CBC
HCT: 38.1 % (ref 36.0–46.0)
Hemoglobin: 13.2 g/dL (ref 12.0–15.0)
MCHC: 34.8 g/dL (ref 30.0–36.0)
MCV: 95.1 fl (ref 78.0–100.0)
Platelets: 255 10*3/uL (ref 150.0–400.0)
RBC: 4 Mil/uL (ref 3.87–5.11)
RDW: 12.7 % (ref 11.5–15.5)
WBC: 6.3 10*3/uL (ref 4.0–10.5)

## 2022-01-25 LAB — TSH: TSH: 1.6 u[IU]/mL (ref 0.35–5.50)

## 2022-01-25 NOTE — Progress Notes (Signed)
? ?Complete physical exam ? ?Patient: DESHANNA KAMA   DOB: Aug 16, 1981   41 y.o. Female  MRN: [id number] ? ?Subjective:  ?  ?CC: CPE ? ? ?NYSHA KOPLIN is a 41 y.o. female who presents today for a complete physical exam. She reports consuming a general diet. The patient does not participate in regular exercise at present. She generally feels well. She reports sleeping fairly well. She does not have additional problems to discuss today.  ? ? ?Most recent fall risk assessment: ? ?  01/25/2022  ?  8:34 AM  ?Fall Risk   ?Falls in the past year? 0  ?Number falls in past yr: 0  ?Injury with Fall? 0  ?Risk for fall due to : No Fall Risks  ?Follow up Falls evaluation completed  ? ?  ?Most recent depression screenings: ? ?  01/25/2022  ?  8:34 AM 10/26/2021  ?  2:39 PM  ?PHQ 2/9 Scores  ?PHQ - 2 Score 0 0  ? ? ?Vision:upcoming exam , Dental: No current dental problems and Receives regular dental care, and STD: The patient reports a past history of: HPV as a teenager, but normal paps since then. No concern for STDs today.  ? ?Patient Active Problem List  ? Diagnosis Date Noted  ? Dizziness 11/09/2021  ? Palpitations 11/09/2021  ? 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 Hashimoto's thyroiditis 10/26/2021  ? Anxiety 10/26/2021  ? ?Past Medical History:  ?Diagnosis Date  ? Anxiety   ? Hashimoto's thyroiditis   ? Hemochromatosis   ? ?  ? ?Patient Care Team: ?Mosie Lukes, MD as PCP - General (Family Medicine)  ? ?Outpatient Medications Prior to Visit  ?Medication Sig  ? Cholecalciferol 1.25 MG (50000 UT) capsule Take 1 capsule by mouth every 30 (thirty) days.  ? clonazePAM (KLONOPIN) 0.5 MG tablet TAKE 1 TO 2 TABLETS BY MOUTH TWICE DAILY FOR ANXIETY  ? FLUoxetine (PROZAC) 20 MG capsule TAKE 1 CAPSULE BY MOUTH EVERY DAY IN THE MORNING  ? Semaglutide-Weight Management (WEGOVY) 2.4 MG/0.75ML SOAJ Inject (2.$RemoveBefore'4MG'edbdfqhWTNVSc$ ) into the skin every week on the same day of each week (Patient not taking: Reported on 11/09/2021)  ? thyroid  (NP THYROID) 15 MG tablet TAKE 1 TABLET BY MOUTH FIRST THING IN THE MORNING ON AN EMPTY STOMACH  ? valACYclovir (VALTREX) 1000 MG tablet Take 1 tablet by mouth 3 (three) times daily for 7 days  ? [DISCONTINUED] COVID-19 At Home Antigen Test (GENABIO COVID-19 RAPID TEST) KIT Use as directed.  ? [DISCONTINUED] Semaglutide-Weight Management (WEGOVY) 2.4 MG/0.75ML SOAJ Inject 2.$RemoveBefor'4MG'myireYdYvQgx$  into the skin every week on the same day (Patient not taking: Reported on 11/09/2021)  ? ?No facility-administered medications prior to visit.  ? ? ?ROS ?All review of systems negative except what is listed in the HPI ? ? ? ? ?   ?Objective:  ? ?  ?BP 98/63   Pulse 77   Ht $R'5\' 8"'iH$  (1.727 m)   Wt 177 lb 9.6 oz (80.6 kg)   BMI 27.00 kg/m?  ? ? ?Physical Exam ?Vitals reviewed.  ?Constitutional:   ?   Appearance: Normal appearance. She is normal weight.  ?HENT:  ?   Head: Normocephalic and atraumatic.  ?   Right Ear: Tympanic membrane normal.  ?   Left Ear: Tympanic membrane normal.  ?   Nose: Nose normal.  ?   Mouth/Throat:  ?   Mouth: Mucous membranes are moist.  ?   Pharynx: Oropharynx is clear.  ?Eyes:  ?  Extraocular Movements: Extraocular movements intact.  ?   Conjunctiva/sclera: Conjunctivae normal.  ?   Pupils: Pupils are equal, round, and reactive to light.  ?Cardiovascular:  ?   Rate and Rhythm: Normal rate and regular rhythm.  ?   Pulses: Normal pulses.  ?   Heart sounds: Normal heart sounds.  ?Pulmonary:  ?   Effort: Pulmonary effort is normal.  ?   Breath sounds: Normal breath sounds.  ?Abdominal:  ?   General: Abdomen is flat. Bowel sounds are normal.  ?   Palpations: Abdomen is soft.  ?   Tenderness: There is no right CVA tenderness or left CVA tenderness.  ?Genitourinary: ?   General: Normal vulva.  ?   Vagina: No vaginal discharge.  ?Musculoskeletal:     ?   General: Normal range of motion.  ?   Cervical back: Normal range of motion and neck supple. No tenderness.  ?Lymphadenopathy:  ?   Cervical: No cervical adenopathy.   ?Skin: ?   General: Skin is warm and dry.  ?   Capillary Refill: Capillary refill takes less than 2 seconds.  ?Neurological:  ?   General: No focal deficit present.  ?   Mental Status: She is alert and oriented to person, place, and time. Mental status is at baseline.  ?Psychiatric:     ?   Mood and Affect: Mood normal.     ?   Behavior: Behavior normal.     ?   Thought Content: Thought content normal.     ?   Judgment: Judgment normal.  ?  ? ?No results found for any visits on 01/25/22. ? ?   ?Assessment & Plan:  ?  ?Routine Health Maintenance and Physical Exam ? ?Immunization History  ?Administered Date(s) Administered  ? PFIZER(Purple Top)SARS-COV-2 Vaccination 10/29/2019, 12/25/2019  ? Tdap 01/07/2018  ? ? ?Health Maintenance  ?Topic Date Due  ? Hepatitis C Screening  Never done  ? PAP SMEAR-Modifier  Never done  ? COVID-19 Vaccine (3 - Booster for Pfizer series) 02/19/2020  ? INFLUENZA VACCINE  05/09/2022  ? TETANUS/TDAP  01/08/2028  ? HIV Screening  Completed  ? HPV VACCINES  Aged Out  ? ? ?Discussed health benefits of physical activity, and encouraged her to engage in regular exercise appropriate for her age and condition. ? ?Problem List Items Addressed This Visit   ?None ?Visit Diagnoses   ? ? Annual physical exam    -  Primary  ? Relevant Orders  ? CBC  ? Comprehensive metabolic panel  ? Lipid panel  ? TSH  ? Hepatitis C antibody  ? HIV Antibody (routine testing w rflx)  ? Cytology - PAP( [place])  ? Cervical cancer screening      ? Relevant Orders  ? Cytology - PAP( [place])  ? Encounter for screening for HIV      ? Relevant Orders  ? HIV Antibody (routine testing w rflx)  ? Encounter for hepatitis C screening test for low risk patient      ? Relevant Orders  ? Hepatitis C antibody  ? ?  ? ?Return for CPE. ? ?  ? ?Terrilyn Saver, NP ? ? ?

## 2022-01-26 ENCOUNTER — Other Ambulatory Visit (HOSPITAL_COMMUNITY): Payer: Self-pay

## 2022-01-26 LAB — HIV ANTIBODY (ROUTINE TESTING W REFLEX): HIV 1&2 Ab, 4th Generation: NONREACTIVE

## 2022-01-26 LAB — HEPATITIS C ANTIBODY
Hepatitis C Ab: NONREACTIVE
SIGNAL TO CUT-OFF: 0.09 (ref <1.00)

## 2022-01-26 MED ORDER — METHYLPHENIDATE HCL ER (OSM) 18 MG PO TBCR
EXTENDED_RELEASE_TABLET | ORAL | 0 refills | Status: DC
  Filled 2022-01-26: qty 30, 30d supply, fill #0

## 2022-01-30 LAB — CYTOLOGY - PAP
Comment: NEGATIVE
Diagnosis: NEGATIVE
Diagnosis: REACTIVE
High risk HPV: NEGATIVE

## 2022-02-08 ENCOUNTER — Inpatient Hospital Stay: Payer: 59

## 2022-03-01 ENCOUNTER — Ambulatory Visit: Payer: 59 | Admitting: Cardiology

## 2022-03-01 ENCOUNTER — Encounter: Payer: Self-pay | Admitting: Cardiology

## 2022-03-01 VITALS — BP 90/70 | HR 86 | Ht 68.5 in | Wt 182.0 lb

## 2022-03-01 DIAGNOSIS — R002 Palpitations: Secondary | ICD-10-CM | POA: Diagnosis not present

## 2022-03-01 DIAGNOSIS — F988 Other specified behavioral and emotional disorders with onset usually occurring in childhood and adolescence: Secondary | ICD-10-CM | POA: Insufficient documentation

## 2022-03-01 DIAGNOSIS — R0609 Other forms of dyspnea: Secondary | ICD-10-CM

## 2022-03-01 DIAGNOSIS — F419 Anxiety disorder, unspecified: Secondary | ICD-10-CM | POA: Diagnosis not present

## 2022-03-01 DIAGNOSIS — F900 Attention-deficit hyperactivity disorder, predominantly inattentive type: Secondary | ICD-10-CM | POA: Insufficient documentation

## 2022-03-01 DIAGNOSIS — Z1371 Encounter for nonprocreative screening for genetic disease carrier status: Secondary | ICD-10-CM | POA: Insufficient documentation

## 2022-03-01 NOTE — Progress Notes (Signed)
Cardiology Office Note:    Date:  03/01/2022   ID:  Amber Sweeney, DOB 1981/10/02, MRN [id number]  PCP:  Bradd Canary, MD  Cardiologist:  Gypsy Balsam, MD    Referring MD: Bradd Canary, MD   Chief Complaint  Patient presents with   Follow-up    History of Present Illness:    Amber Sweeney is a 41 y.o. female with past medical history significant for hemochromatosis, palpitations history of Hashimoto thyroiditis years ago she was referred to [place] because of palpitations previously diagnosed with PVCs at that time she did have monitoring done also echocardiogram which was normal.  However she was referred back to [place].  I did put on her another monitor which showed some ectopy nothing significant multiple triggered event showing normal sinus rhythm, she did not do echocardiogram because of price of the test.  She is thinking about doing this in different planes.  Overall she is doing well.  Denies have any chest pain tightness squeezing pressure burning chest.  She denied blood every month which keep her H&H in order.  Past Medical History:  Diagnosis Date   Anxiety    Hashimoto's thyroiditis    Hemochromatosis     Past Surgical History:  Procedure Laterality Date   CHOLECYSTECTOMY     Ovaries and tubes removed     WISDOM TOOTH EXTRACTION      Current Medications: Current Meds  Medication Sig   Cholecalciferol 1.25 MG (50000 UT) capsule Take 1 capsule by mouth every 30 (thirty) days.   clonazePAM (KLONOPIN) 0.5 MG tablet TAKE 1 TO 2 TABLETS BY MOUTH TWICE DAILY FOR ANXIETY (Patient taking differently: Take 0.5-1 mg by mouth 2 (two) times daily as needed for anxiety.)   FLUoxetine (PROZAC) 20 MG capsule TAKE 1 CAPSULE BY MOUTH EVERY DAY IN THE MORNING (Patient taking differently: Take 20 mg by mouth in the morning.)   Semaglutide-Weight Management (WEGOVY) 2.4 MG/0.75ML SOAJ Inject (2.4MG ) into the skin every week on the same day of each week (Patient taking  differently: Inject 2.4 mg into the skin once a week.)   thyroid (NP THYROID) 15 MG tablet TAKE 1 TABLET BY MOUTH FIRST THING IN THE MORNING ON AN EMPTY STOMACH (Patient taking differently: Take 15 mg by mouth daily.)   valACYclovir (VALTREX) 1000 MG tablet Take 1 tablet by mouth 3 (three) times daily for 7 days     Allergies:   Celecoxib, Gabapentin, Iodinated contrast media, Levothyroxine sodium, Liraglutide -weight management, Pregabalin, Wellbutrin [bupropion], Cephalosporins, and Cyclosporine   Social History   Socioeconomic History   Marital status: Single    Spouse name: Not on file   Number of children: Not on file   Years of education: Not on file   Highest education level: Not on file  Occupational History   Not on file  Tobacco Use   Smoking status: Former    Types: Cigarettes    Quit date: 10/10/2018    Years since quitting: 3.3    Passive exposure: Past   Smokeless tobacco: Never   Tobacco comments:    Quit smoking 10/2018  Vaping Use   Vaping Use: Every day   Start date: 10/09/2018  Substance and Sexual Activity   Alcohol use: Not Currently   Drug use: Not Currently   Sexual activity: Not on file  Other Topics Concern   Not on file  Social History Narrative   Not on file   Social Determinants of Health  Financial Resource Strain: Not on file  Food Insecurity: Not on file  Transportation Needs: Not on file  Physical Activity: Not on file  Stress: Not on file  Social Connections: Not on file     Family History: The patient's family history includes Anemia in her sister; BRCA 1/2 in her mother and sister; Breast cancer in her maternal aunt; Diabetes in her brother and father; Hashimoto's thyroiditis in her sister; Valvular heart disease in her mother. ROS:   Please see the history of present illness.    All 14 point review of systems negative except as described per history of present illness  EKGs/Labs/Other Studies Reviewed:      Recent  Labs: 01/25/2022: ALT 28; BUN 17; Creatinine, Ser 0.77; Hemoglobin 13.2; Platelets 255.0; Potassium 4.3; Sodium 140; TSH 1.60  Recent Lipid Panel    Component Value Date/Time   CHOL 171 01/25/2022 0904   TRIG 119.0 01/25/2022 0904   HDL 47.00 01/25/2022 0904   CHOLHDL 4 01/25/2022 0904   VLDL 23.8 01/25/2022 0904   LDLCALC 100 (H) 01/25/2022 0904    Physical Exam:    VS:  BP 90/70 (BP Location: Left Arm, Patient Position: Sitting)   Pulse 86   Ht 5' 8.5" (1.74 m)   Wt 182 lb (82.6 kg)   SpO2 96%   BMI 27.27 kg/m     Wt Readings from Last 3 Encounters:  03/01/22 182 lb (82.6 kg)  01/25/22 177 lb 9.6 oz (80.6 kg)  12/28/21 177 lb (80.3 kg)     GEN:  Well nourished, well developed in no acute distress HEENT: Normal NECK: No JVD; No carotid bruits LYMPHATICS: No lymphadenopathy CARDIAC: RRR, no murmurs, no rubs, no gallops RESPIRATORY:  Clear to auscultation without rales, wheezing or rhonchi  ABDOMEN: Soft, non-tender, non-distended MUSCULOSKELETAL:  No edema; No deformity  SKIN: Warm and dry LOWER EXTREMITIES: no swelling NEUROLOGIC:  Alert and oriented x 3 PSYCHIATRIC:  Normal affect   ASSESSMENT:    1. Palpitations   2. Hereditary hemochromatosis ([place])   3. Anxiety    PLAN:    In order of problems listed above:  Palpitations monitor did not show significant arrhythmia.  We will continue monitoring. Hemochromatosis I insist on her getting echocardiogram because we would like to make sure there are no deposits of iron within her heart and I will make sure his ejection fraction is normal.  She complaining of having weakness fatigue and tiredness which could be of course related to cardiomyopathy therefore I have to make sure her ejection fraction is normal even though she does not have any signs and symptoms except for shortness of breath of cardiomyopathy. Anxiety: Stable: Dyslipidemia I did review her K PN which show me her LDL of  Dyslipidemia I did review K PN  which show LDL of 108 HDL 47 continue present management   Medication Adjustments/Labs and Tests Ordered: Current medicines are reviewed at length with the patient today.  Concerns regarding medicines are outlined above.  No orders of the defined types were placed in this encounter.  Medication changes: No orders of the defined types were placed in this encounter.   Signed, Park Liter, MD, [facility] 03/01/2022 3:14 PM    [place]

## 2022-03-01 NOTE — Patient Instructions (Addendum)
Medication Instructions:  Your physician recommends that you continue on your current medications as directed. Please refer to the Current Medication list given to you today.  *If you need a refill on your cardiac medications before your next appointment, please call your pharmacy*   Lab Work: None Ordered If you have labs (blood work) drawn today and your tests are completely normal, you will receive your results only by: MyChart Message (if you have MyChart) OR A paper copy in the mail If you have any lab test that is abnormal or we need to change your treatment, we will call you to review the results.   Testing/Procedures: Your physician has requested that you have an echocardiogram.  [address] [place], [place] [address] Echocardiography is a painless test that uses sound waves to create images of your heart. It provides your doctor with information about the size and shape of your heart and how well your heart's chambers and valves are working. This procedure takes approximately one hour. There are no restrictions for this procedure.    Follow-Up: At [facility], you and your health needs are our priority.  As part of our continuing mission to provide you with exceptional heart care, we have created designated Provider Care Teams.  These Care Teams include your primary Cardiologist (physician) and Advanced Practice Providers (APPs -  Physician Assistants and Nurse Practitioners) who all work together to provide you with the care you need, when you need it.  We recommend signing up for the patient portal called "MyChart".  Sign up information is provided on this After Visit Summary.  MyChart is used to connect with patients for Virtual Visits (Telemedicine).  Patients are able to view lab/test results, encounter notes, upcoming appointments, etc.  Non-urgent messages can be sent to your provider as well.   To learn more about what you can do with MyChart, go to [email or web address].     Your next appointment:   5 month(s)  The format for your next appointment:   In Person  Provider:   Gypsy Balsam, MD    Other Instructions NA

## 2022-03-01 NOTE — Addendum Note (Signed)
Addended by: Jacobo Forest D on: 03/01/2022 03:33 PM   Modules accepted: Orders

## 2022-03-07 ENCOUNTER — Encounter: Payer: Self-pay | Admitting: Cardiology

## 2022-03-16 ENCOUNTER — Telehealth: Payer: Self-pay | Admitting: *Deleted

## 2022-03-16 NOTE — Telephone Encounter (Signed)
Called patient about rescheduling appointments - confirmed 

## 2022-03-22 ENCOUNTER — Other Ambulatory Visit: Payer: 59

## 2022-03-24 ENCOUNTER — Encounter: Payer: Self-pay | Admitting: Family

## 2022-03-24 ENCOUNTER — Other Ambulatory Visit (HOSPITAL_COMMUNITY): Payer: Self-pay

## 2022-03-24 MED ORDER — VITAMIN D3 1.25 MG (50000 UT) PO CAPS
1.0000 | ORAL_CAPSULE | ORAL | 1 refills | Status: DC
  Filled 2022-03-24: qty 3, 90d supply, fill #0
  Filled 2022-05-22 – 2022-09-04 (×4): qty 3, 90d supply, fill #1

## 2022-03-24 MED ORDER — NP THYROID 15 MG PO TABS
ORAL_TABLET | ORAL | 1 refills | Status: DC
  Filled 2022-03-24: qty 90, 90d supply, fill #0
  Filled 2022-05-25 – 2022-09-04 (×3): qty 90, 90d supply, fill #1

## 2022-03-24 MED ORDER — CLONAZEPAM 0.5 MG PO TABS
ORAL_TABLET | ORAL | 0 refills | Status: DC
  Filled 2022-03-24: qty 120, 30d supply, fill #0

## 2022-03-28 ENCOUNTER — Other Ambulatory Visit (HOSPITAL_COMMUNITY): Payer: Self-pay

## 2022-03-29 ENCOUNTER — Ambulatory Visit: Payer: 59 | Admitting: Family

## 2022-03-29 ENCOUNTER — Other Ambulatory Visit: Payer: 59

## 2022-04-04 ENCOUNTER — Encounter: Payer: Self-pay | Admitting: Family

## 2022-04-04 ENCOUNTER — Other Ambulatory Visit (HOSPITAL_COMMUNITY): Payer: Self-pay

## 2022-04-05 ENCOUNTER — Inpatient Hospital Stay (HOSPITAL_BASED_OUTPATIENT_CLINIC_OR_DEPARTMENT_OTHER): Payer: 59 | Admitting: Family

## 2022-04-05 ENCOUNTER — Inpatient Hospital Stay: Payer: 59 | Attending: Hematology & Oncology

## 2022-04-05 ENCOUNTER — Other Ambulatory Visit: Payer: Self-pay

## 2022-04-05 ENCOUNTER — Encounter: Payer: Self-pay | Admitting: Family

## 2022-04-05 LAB — CMP (CANCER CENTER ONLY)
ALT: 18 U/L (ref 0–44)
AST: 14 U/L — ABNORMAL LOW (ref 15–41)
Albumin: 4.3 g/dL (ref 3.5–5.0)
Alkaline Phosphatase: 49 U/L (ref 38–126)
Anion gap: 8 (ref 5–15)
BUN: 19 mg/dL (ref 6–20)
CO2: 27 mmol/L (ref 22–32)
Calcium: 9.3 mg/dL (ref 8.9–10.3)
Chloride: 104 mmol/L (ref 98–111)
Creatinine: 0.79 mg/dL (ref 0.44–1.00)
GFR, Estimated: >60 mL/min (ref >60)
Glucose, Bld: 164 mg/dL — ABNORMAL HIGH (ref 70–99)
Potassium: 4.2 mmol/L (ref 3.5–5.1)
Sodium: 139 mmol/L (ref 135–145)
Total Bilirubin: 0.7 mg/dL (ref 0.3–1.2)
Total Protein: 6.7 g/dL (ref 6.5–8.1)

## 2022-04-05 LAB — CBC WITH DIFFERENTIAL (CANCER CENTER ONLY)
Abs Immature Granulocytes: 0.01 10*3/uL (ref 0.00–0.07)
Basophils Absolute: 0.1 10*3/uL (ref 0.0–0.1)
Basophils Relative: 1 %
Eosinophils Absolute: 0.1 10*3/uL (ref 0.0–0.5)
Eosinophils Relative: 2 %
HCT: 38.1 % (ref 36.0–46.0)
Hemoglobin: 12.7 g/dL (ref 12.0–15.0)
Immature Granulocytes: 0 %
Lymphocytes Relative: 36 %
Lymphs Abs: 2.4 10*3/uL (ref 0.7–4.0)
MCH: 32.2 pg (ref 26.0–34.0)
MCHC: 33.3 g/dL (ref 30.0–36.0)
MCV: 96.5 fL (ref 80.0–100.0)
Monocytes Absolute: 0.3 10*3/uL (ref 0.1–1.0)
Monocytes Relative: 4 %
Neutro Abs: 3.9 10*3/uL (ref 1.7–7.7)
Neutrophils Relative %: 57 %
Platelet Count: 245 10*3/uL (ref 150–400)
RBC: 3.95 MIL/uL (ref 3.87–5.11)
RDW: 12.1 % (ref 11.5–15.5)
WBC Count: 6.8 10*3/uL (ref 4.0–10.5)
nRBC: 0 % (ref 0.0–0.2)

## 2022-04-05 LAB — FERRITIN: Ferritin: 249 ng/mL (ref 11–307)

## 2022-04-05 NOTE — Progress Notes (Signed)
Hematology and Oncology Follow Up Visit  Amber Sweeney [id number] [birth year] 41 y.o. 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 She is doing well and has no complaints at this time.  She [facility]. Her iron studies and ferritin have remained stable.  No fever, chills, n/v, cough, rash, dizziness, SOB, chest pain, palpitations, abdominal pain/bloating or changes in bowel or bladder habits.  No swelling, tenderness, numbness or tingling in her extremities.  No falls or syncope reported.  She has maintained a good appetite and is staying well hydrated throughout the day. Her weight is stable at 178 lbs.   ECOG Performance Status: 0 - Asymptomatic  Medications:  Allergies as of 04/05/2022       Reactions   Celecoxib Other (See Comments)   Patient stated it gave her pancreatitis   Gabapentin Palpitations   Iodinated Contrast Media Other (See Comments)   Panic Attack   Levothyroxine Sodium Palpitations   Liraglutide -weight Management Other (See Comments)   Injection site turned black for months   Pregabalin Other (See Comments)   Panic attack   Wellbutrin [bupropion] Other (See Comments)   "I didn't care about anything"   Cephalosporins Rash   Cyclosporine Hives        Medication List        Accurate as of April 05, 2022  2:05 PM. If you have any questions, ask your nurse or doctor.          clonazePAM 0.5 MG tablet Commonly known as: KLONOPIN TAKE 1 TO 2 TABLETS BY MOUTH TWICE DAILY FOR ANXIETY   FLUoxetine 20 MG capsule Commonly known as: PROzac TAKE 1 CAPSULE BY MOUTH EVERY DAY IN THE MORNING What changed:  how much to take how to take this when to take this   NP Thyroid 15 MG tablet Generic drug: thyroid TAKE 1  TABLET BY MOUTH FIRST THING IN THE MORNING ON AN EMPTY STOMACH   valACYclovir 1000 MG tablet Commonly known as: Valtrex Take 1 tablet by mouth 3 (three) times daily for 7 days   Vitamin D3 1.25 MG (50000 UT) Caps Take 1 capsule by mouth every 30 (thirty) days.   Vitamin D3 1.25 MG (50000 UT) Caps Take 1 capsule by mouth every 30 (thirty) days.   Wegovy 2.4 MG/0.75ML Soaj Generic drug: Semaglutide-Weight Management Inject (2.4MG ) into the skin every week on the same day of each week What changed:  how much to take how to take this when to take this        Allergies:  Allergies  Allergen Reactions   Celecoxib Other (See Comments)    Patient stated it gave her pancreatitis   Gabapentin Palpitations   Iodinated Contrast Media Other (See Comments)    Panic Attack   Levothyroxine Sodium Palpitations   Liraglutide -Weight Management Other (See Comments)    Injection site turned black for months   Pregabalin Other (See Comments)    Panic attack     Wellbutrin [Bupropion] Other (See Comments)    "I didn't care about anything"   Cephalosporins Rash   Cyclosporine Hives    Past Medical History, Surgical history, Social history, and Family History were reviewed and updated.  Review of Systems: All  other 10 point review of systems is negative.   Physical Exam:  height is 5\' 8"  (1.727 m) and weight is 178 lb 6.4 oz (80.9 kg). Her oral temperature is 98.7 F (37.1 C). Her blood pressure is 97/57 (abnormal) and her pulse is 85. Her respiration is 18 and oxygen saturation is 100%.   Wt Readings from Last 3 Encounters:  04/05/22 178 lb 6.4 oz (80.9 kg)  03/01/22 182 lb (82.6 kg)  01/25/22 177 lb 9.6 oz (80.6 kg)    Ocular: Sclerae unicteric, pupils equal, round and reactive to light Ear-nose-throat: Oropharynx clear, dentition fair Lymphatic: No cervical or supraclavicular adenopathy Lungs no rales or rhonchi, good excursion bilaterally Heart regular rate and rhythm, no  murmur appreciated Abd soft, nontender, positive bowel sounds MSK no focal spinal tenderness, no joint edema Neuro: non-focal, well-oriented, appropriate affect Breasts: Deferred   Lab Results  Component Value Date   WBC 6.8 04/05/2022   HGB 12.7 04/05/2022   HCT 38.1 04/05/2022   MCV 96.5 04/05/2022   PLT 245 04/05/2022   Lab Results  Component Value Date   FERRITIN 129 12/28/2021   IRON 81 12/28/2021   TIBC 258 12/28/2021   UIBC 177 12/28/2021   IRONPCTSAT 31 12/28/2021   Lab Results  Component Value Date   RBC 3.95 04/05/2022   No results found for: "KPAFRELGTCHN", "LAMBDASER", "KAPLAMBRATIO" No results found for: "IGGSERUM", "IGA", "IGMSERUM" No results found for: "TOTALPROTELP", "ALBUMINELP", "A1GS", "A2GS", "BETS", "BETA2SER", "GAMS", "MSPIKE", "SPEI"   Chemistry      Component Value Date/Time   NA 140 01/25/2022 0904   K 4.3 01/25/2022 0904   CL 105 01/25/2022 0904   CO2 28 01/25/2022 0904   BUN 17 01/25/2022 0904   CREATININE 0.77 01/25/2022 0904   CREATININE 0.77 12/28/2021 1300      Component Value Date/Time   CALCIUM 8.9 01/25/2022 0904   ALKPHOS 45 01/25/2022 0904   AST 21 01/25/2022 0904   AST 13 (L) 12/28/2021 1300   ALT 28 01/25/2022 0904   ALT 14 12/28/2021 1300   BILITOT 0.3 01/25/2022 0904   BILITOT 0.3 12/28/2021 1300       Impression and Plan: Amber Sweeney is a very pleasant 41 yo caucasian female with history of hemochromatosis, homozygous for the C282Y mutation.  Iron studies are pending. She enjoys donating with the [facility] as needed.  Follow-up in 3 months.   [employer], NP 6/28/20232:05 PM

## 2022-04-06 LAB — IRON AND IRON BINDING CAPACITY (CC-WL,HP ONLY)
Iron: 201 ug/dL — ABNORMAL HIGH (ref 28–170)
Saturation Ratios: 84 % — ABNORMAL HIGH (ref 10.4–31.8)
TIBC: 239 ug/dL — ABNORMAL LOW (ref 250–450)
UIBC: 38 ug/dL — ABNORMAL LOW (ref 148–442)

## 2022-04-10 ENCOUNTER — Telehealth: Payer: Self-pay | Admitting: *Deleted

## 2022-04-10 ENCOUNTER — Telehealth: Payer: 59 | Admitting: Urgent Care

## 2022-04-10 DIAGNOSIS — B850 Pediculosis due to Pediculus humanus capitis: Secondary | ICD-10-CM | POA: Diagnosis not present

## 2022-04-10 MED ORDER — IVERMECTIN 0.5 % EX LOTN: 1.0000 "application " | TOPICAL_LOTION | Freq: Once | CUTANEOUS | 0 refills | Status: AC

## 2022-04-10 MED ORDER — IVERMECTIN 0.5 % EX LOTN: 1.0000 "application " | TOPICAL_LOTION | Freq: Once | CUTANEOUS | 0 refills | Status: DC

## 2022-04-10 NOTE — Telephone Encounter (Signed)
Per scheduling message Maralyn Sago - Called patient to schedule a lab appointment - patient did not want to schedule until she gets her new work schedule. She said that she would call back on Thursday. She understood the instructions with [facility].

## 2022-04-10 NOTE — Progress Notes (Signed)
E-Visit for Lice  We are sorry that you are not feeling well. Here is how we plan to help!  Based on what you have shared with me it looks like you have head lice.  Lice are very tiny insects that like to live on hair.  An infection with head lice is very common in school-age children, but anyone can get lice.  Head lice do not live on pets and cannot jump, fly or walk on the ground.  But they easily pass from person to person through close contact and on clothes, bed linens, brushes, combs, hats and toys.  Head lice infections are not dangerous but they can be difficult to treat.  Lice are contagious and should be treated right away to stop infection from spreading.   I recommend that you use: I have prescribed Ivermectin lotion (Sklice) 0.5%.  This may be used with or without nit combing.  Apply once to the entire area of scalp and hair.  Do not repeat without talking with a healthcare provider.   HOME CARE:  You should treat all members in your household. Wash towels, clothes, bed linens, cloth toys, hats and other personal items in hot water and dry on high heat. Wash all combs and brushes in very hot soapy water or throw them out and buy new ones. Vacuum floors and furniture and throw out the bag afterwards. Do not go to work or school until the morning after your treatment for lice. If family members are also infected, you may need to notify your child's day care or school so that other children can be checked.  GET HELP RIGHT AWAY IF:  Your treatment does not get rid of lice. You develop sores on your scalp that become infected, worsen or do not heal. You or your child become itchy or are scratching in areas other than the scalp.  MAKE SURE YOU:  Understand these instructions. Will watch your condition. Will get help right away if you are not doing well or get worse.  Your e-visit answers were reviewed by a board certified advanced clinical practitioner to complete your personal  care plan.  Depending upon the condition, your plan could have included both over the counter or prescription medications.    Please review your pharmacy choice.  Make sure the pharmacy is open so you can pick up prescription now.   If there is a problem, you may contact your provider through [employer] and have the prescription routed to another pharmacy. Your safety is important to [place].  If you have drug allergies check your prescription carefully.     For the next 24 hours you can use MyChart to ask questions about today's visit, request a non-urgent call back, or ask for a work or school excuse.  You will get an email in the next 2 days asking about your experience.  I hope that your e-visit has been valuable and will speed your recovery.    I have spent 5 minutes in review of e-visit questionnaire, review and updating patient chart, medical decision making and response to patient.   Tasnim Balentine L Berneita Sanagustin, PA

## 2022-04-10 NOTE — Addendum Note (Signed)
Addended by: Margaretann Loveless on: 04/10/2022 04:03 PM   Modules accepted: Orders

## 2022-04-18 ENCOUNTER — Other Ambulatory Visit (HOSPITAL_COMMUNITY): Payer: Self-pay

## 2022-04-27 ENCOUNTER — Other Ambulatory Visit (HOSPITAL_COMMUNITY): Payer: Self-pay

## 2022-04-27 MED ORDER — CLONAZEPAM 0.5 MG PO TABS
ORAL_TABLET | ORAL | 0 refills | Status: DC
  Filled 2022-04-27: qty 120, 30d supply, fill #0

## 2022-04-28 ENCOUNTER — Other Ambulatory Visit (HOSPITAL_COMMUNITY): Payer: Self-pay

## 2022-05-22 ENCOUNTER — Other Ambulatory Visit (HOSPITAL_COMMUNITY): Payer: Self-pay

## 2022-05-22 MED ORDER — CLONAZEPAM 0.5 MG PO TABS
ORAL_TABLET | ORAL | 0 refills | Status: DC
  Filled 2022-05-22: qty 120, 60d supply, fill #0
  Filled 2022-05-24: qty 120, 30d supply, fill #0

## 2022-05-24 ENCOUNTER — Other Ambulatory Visit (HOSPITAL_COMMUNITY): Payer: Self-pay

## 2022-05-25 ENCOUNTER — Other Ambulatory Visit (HOSPITAL_COMMUNITY): Payer: Self-pay

## 2022-06-05 ENCOUNTER — Other Ambulatory Visit (HOSPITAL_COMMUNITY): Payer: Self-pay

## 2022-06-16 ENCOUNTER — Other Ambulatory Visit (HOSPITAL_COMMUNITY): Payer: Self-pay

## 2022-06-24 ENCOUNTER — Ambulatory Visit
Admission: RE | Admit: 2022-06-24 | Discharge: 2022-06-24 | Disposition: A | Discharge status: Home / Self Care | Payer: 59 | Source: Ambulatory Visit | Attending: Family Medicine | Admitting: Family Medicine

## 2022-06-24 VITALS — BP 115/80 | HR 63 | Temp 98.6°F | Resp 18 | Wt 176.0 lb

## 2022-06-24 DIAGNOSIS — R1013 Epigastric pain: Secondary | ICD-10-CM | POA: Diagnosis not present

## 2022-06-24 DIAGNOSIS — Z8619 Personal history of other infectious and parasitic diseases: Secondary | ICD-10-CM | POA: Diagnosis not present

## 2022-06-24 DIAGNOSIS — Z9049 Acquired absence of other specified parts of digestive tract: Secondary | ICD-10-CM

## 2022-06-24 LAB — POCT URINALYSIS DIP (MANUAL ENTRY)
Bilirubin, UA: NEGATIVE
Blood, UA: NEGATIVE
Glucose, UA: NEGATIVE mg/dL
Ketones, POC UA: NEGATIVE mg/dL
Leukocytes, UA: NEGATIVE
Nitrite, UA: NEGATIVE
Protein Ur, POC: NEGATIVE mg/dL
Spec Grav, UA: 1.01 (ref 1.010–1.025)
Urobilinogen, UA: 0.2 E.U./dL
pH, UA: 5 (ref 5.0–8.0)

## 2022-06-24 MED ORDER — ALUM & MAG HYDROXIDE-SIMETH 200-200-20 MG/5ML PO SUSP
30.0000 mL | Freq: Once | ORAL | Status: AC
  Administered 2022-06-24: 30 mL via ORAL

## 2022-06-24 MED ORDER — OMEPRAZOLE 40 MG PO CPDR: 40.0000 mg | DELAYED_RELEASE_CAPSULE | Freq: Every day | ORAL | 0 refills | Status: DC

## 2022-06-24 MED ORDER — LIDOCAINE VISCOUS HCL 2 % MT SOLN
15.0000 mL | Freq: Once | OROMUCOSAL | Status: AC
  Administered 2022-06-24: 15 mL via ORAL

## 2022-06-24 MED ORDER — SUCRALFATE 1 GM/10ML PO SUSP: 1.0000 g | Freq: Three times a day (TID) | ORAL | 0 refills | Status: DC

## 2022-06-24 NOTE — ED Provider Notes (Signed)
[facility]    CSN: [id number] Arrival date & time: 06/24/22  1404      History   Chief Complaint Chief Complaint  Patient presents with   Abdominal Pain    I have a history of h pylori and that's what it feels like. I have been nauseous for a couple days and there is burning around belly button, indigestion and pain in my mid back. I dont have a gallbladder. But it  feels like a gallbladder attack. - Entered by patient    HPI ILLYANNA Sweeney is a 41 y.o. female.   HPI  Patient is here for acute abdominal pain.  She has had some nausea for the last couple of days but nothing that kept her from her usual work and activities.  Today when she woke up she had severe burning abdominal pain.  It is present in the upper middle abdomen above her umbilicus and shoots through to her back.  She states she has not been able to eat anything today.  Has had a little bit of water and tea.  Has had nausea but no vomiting.  Is soft but normal bowel movement.  No change in food.  No alcohol.  No anti-inflammatories.  No stomach irritants.  No change in medications.  She does admit that she has had a little more stress lately. Patient states she has a history of H. pylori infection years ago.  This feels similar.  Her H. pylori was successfully treated Patient has a history of gallstones.  This feels a little bit like her gallbladder disease, but she did have a cholecystectomy. 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      Home Medications    Prior to Admission medications   Medication Sig Start Date End Date Taking? Authorizing Provider  omeprazole (PRILOSEC) 40 MG capsule Take 1 capsule (40 mg total) by mouth daily. 06/24/22  Yes Raylene Everts, MD  sucralfate (CARAFATE) 1 GM/10ML suspension Take 10 mLs (1 g total) by mouth 4 (four) times daily -  with meals and at bedtime. 06/24/22  Yes Raylene Everts, MD  Cholecalciferol (VITAMIN D3) 1.25 MG (50000 UT) CAPS Take 1 capsule by mouth every 30 (thirty) days. 03/24/22     Cholecalciferol 1.25 MG (50000 UT) capsule Take 1 capsule by mouth every 30 (thirty) days. 09/13/21     clonazePAM (KLONOPIN) 0.5 MG tablet TAKE 1 TO 2 TABLETS BY MOUTH TWICE DAILY FOR ANXIETY 05/22/22     FLUoxetine (PROZAC) 20 MG capsule TAKE 1 CAPSULE BY MOUTH EVERY DAY IN THE MORNING Patient taking differently: Take 20 mg by mouth in the morning. 08/26/21     Semaglutide-Weight Management (WEGOVY) 2.4  MG/0.75ML SOAJ Inject (2.4MG ) into the skin every week on the same day of each week Patient taking differently: Inject 2.4 mg into the skin once a week. 10/13/21     thyroid (NP THYROID) 15 MG tablet TAKE 1 TABLET BY MOUTH FIRST THING IN THE MORNING ON AN EMPTY STOMACH 03/24/22     valACYclovir (VALTREX) 1000 MG tablet Take 1 tablet by mouth 3 (three) times daily for 7 days 12/25/21   Junie Spencer, FNP    Family History Family History  Problem Relation Age of Onset   Valvular heart disease Mother        Hx valve replacement   BRCA 1/2 Mother    Diabetes Father    Hashimoto's thyroiditis Sister    BRCA 1/2 Sister    Anemia Sister    Diabetes Brother    Breast cancer Maternal Aunt     Social History Social  History   Tobacco Use   Smoking status: Former    Types: Cigarettes    Quit date: 10/10/2018    Years since quitting: 3.7    Passive exposure: Past   Smokeless tobacco: Never   Tobacco comments:    Quit smoking 10/2018  Vaping Use   Vaping Use: Every day   Start date: 10/09/2018  Substance Use Topics   Alcohol use: Not Currently   Drug use: Not Currently     Allergies   Iodinated contrast media, Cephalosporins, Cyclosporine, Celecoxib, Gabapentin, Levothyroxine sodium, Liraglutide -weight management, Pregabalin, and Wellbutrin [bupropion]   Review of Systems Review of Systems See HPI  Physical Exam Triage Vital Signs ED Triage Vitals  Enc Vitals Group     BP 06/24/22 1412 115/80     Pulse Rate 06/24/22 1412 63     Resp 06/24/22 1412 18     Temp 06/24/22 1412 98.6 F (37 C)     Temp Source 06/24/22 1412 Oral     SpO2 06/24/22 1412 98 %     Weight 06/24/22 1413 176 lb (79.8 kg)     Height --      Head Circumference --      Peak Flow --      Pain Score 06/24/22 1413 5     Pain Loc --      Pain Edu? --      Excl. in GC? --    No data found.  Updated Vital Signs BP 115/80 (BP Location: Right Arm)   Pulse 63   Temp 98.6 F (37 C) (Oral)   Resp 18   Wt 79.8 kg   SpO2 98%   BMI 26.76 kg/m        Physical Exam Constitutional:      General: She is not in acute distress.    Appearance: She is well-developed.     Comments: Appears uncomfortable  HENT:     Head: Normocephalic and atraumatic.     Mouth/Throat:     Mouth: Mucous membranes are moist.  Eyes:     Conjunctiva/sclera: Conjunctivae normal.     Pupils: Pupils are equal, round, and reactive to light.  Cardiovascular:     Rate and Rhythm: Normal rate and regular rhythm.     Heart sounds: Normal heart sounds.  Pulmonary:     Effort: Pulmonary effort is normal. No respiratory distress.     Breath sounds: Normal breath sounds.  Abdominal:     General: Abdomen is flat. Bowel sounds are normal. There  is no distension.  Palpations: Abdomen is soft. There is no hepatomegaly or splenomegaly.     Tenderness: There is abdominal tenderness in the epigastric area. There is no right CVA tenderness, left CVA tenderness, guarding or rebound.  Musculoskeletal:        General: Normal range of motion.     Cervical back: Normal range of motion.  Skin:    General: Skin is warm and dry.  Neurological:     General: No focal deficit present.     Mental Status: She is alert.  Psychiatric:        Mood and Affect: Mood normal.        Behavior: Behavior normal.      UC Treatments / Results  Labs (all labs ordered are listed, but only abnormal results are displayed) Labs Reviewed  COMPLETE METABOLIC PANEL WITH GFR  CBC WITH DIFFERENTIAL/PLATELET  LIPASE  POCT URINALYSIS DIP (MANUAL ENTRY)    EKG   Radiology No results found.  Procedures Procedures (including critical care time)  Medications Ordered in UC Medications  alum & mag hydroxide-simeth (MAALOX/MYLANTA) 200-200-20 MG/5ML suspension 30 mL (30 mLs Oral Given 06/24/22 1437)    And  lidocaine (XYLOCAINE) 2 % viscous mouth solution 15 mL (15 mLs Oral Given 06/24/22 1437)    Initial Impression / Assessment and Plan / UC Course  I have reviewed the triage vital signs and the nursing notes.  Pertinent labs & imaging results that were available during my care of the patient were reviewed by me and considered in my medical decision making (see chart for details).      Final Clinical Impressions(s) / UC Diagnoses   Final diagnoses:  Abdominal pain, acute, epigastric  History of Helicobacter pylori infection  Hx of cholecystectomy     Discharge Instructions      Take omeprazole once a day on an empty stomach.  This will reduce your stomach acid. Take sucralfate 3-4 times a day.  This coats the lining of the stomach to reduce the pain Stay on bland diet Check MyChart for test results.  They should be available by  tomorrow See your doctor if not improving by next week     ED Prescriptions     Medication Sig Dispense Auth. Provider   sucralfate (CARAFATE) 1 GM/10ML suspension Take 10 mLs (1 g total) by mouth 4 (four) times daily -  with meals and at bedtime. 420 mL Raylene Everts, MD   omeprazole (PRILOSEC) 40 MG capsule Take 1 capsule (40 mg total) by mouth daily. 30 capsule Raylene Everts, MD      PDMP not reviewed this encounter.   Raylene Everts, MD 06/24/22 1450

## 2022-06-24 NOTE — ED Notes (Signed)
Difficult IV stick - warm packs applied to RAC fossa &  left hand

## 2022-06-24 NOTE — Discharge Instructions (Addendum)
Take omeprazole once a day on an empty stomach.  This will reduce your stomach acid. Take sucralfate 3-4 times a day.  This coats the lining of the stomach to reduce the pain Stay on bland diet Check MyChart for test results.  They should be available by tomorrow See your doctor if not improving by next week

## 2022-06-24 NOTE — ED Triage Notes (Signed)
Pt c/o abdominal pain onset since this morning.

## 2022-06-25 ENCOUNTER — Telehealth: Payer: Self-pay | Admitting: Emergency Medicine

## 2022-06-25 LAB — COMPLETE METABOLIC PANEL WITH GFR
AG Ratio: 2.3 (calc) (ref 1.0–2.5)
ALT: 11 U/L (ref 6–29)
AST: 13 U/L (ref 10–30)
Albumin: 4.5 g/dL (ref 3.6–5.1)
Alkaline phosphatase (APISO): 44 U/L (ref 31–125)
BUN: 12 mg/dL (ref 7–25)
CO2: 28 mmol/L (ref 20–32)
Calcium: 9 mg/dL (ref 8.6–10.2)
Chloride: 106 mmol/L (ref 98–110)
Creat: 0.75 mg/dL (ref 0.50–0.99)
Globulin: 2 g/dL (calc) (ref 1.9–3.7)
Glucose, Bld: 80 mg/dL (ref 65–99)
Potassium: 4.4 mmol/L (ref 3.5–5.3)
Sodium: 139 mmol/L (ref 135–146)
Total Bilirubin: 0.3 mg/dL (ref 0.2–1.2)
Total Protein: 6.5 g/dL (ref 6.1–8.1)
eGFR: 103 mL/min/{1.73_m2} (ref >=60)

## 2022-06-25 LAB — CBC WITH DIFFERENTIAL/PLATELET
Absolute Monocytes: 326 cells/uL (ref 200–950)
Basophils Absolute: 51 cells/uL (ref 0–200)
Basophils Relative: 0.8 %
Eosinophils Absolute: 160 cells/uL (ref 15–500)
Eosinophils Relative: 2.5 %
HCT: 35.9 % (ref 35.0–45.0)
Hemoglobin: 12.4 g/dL (ref 11.7–15.5)
Lymphs Abs: 2470 cells/uL (ref 850–3900)
MCH: 33.3 pg — ABNORMAL HIGH (ref 27.0–33.0)
MCHC: 34.5 g/dL (ref 32.0–36.0)
MCV: 96.5 fL (ref 80.0–100.0)
MPV: 11.1 fL (ref 7.5–12.5)
Monocytes Relative: 5.1 %
Neutro Abs: 3392 cells/uL (ref 1500–7800)
Neutrophils Relative %: 53 %
Platelets: 264 10*3/uL (ref 140–400)
RBC: 3.72 10*6/uL — ABNORMAL LOW (ref 3.80–5.10)
RDW: 12 % (ref 11.0–15.0)
Total Lymphocyte: 38.6 %
WBC: 6.4 10*3/uL (ref 3.8–10.8)

## 2022-06-25 LAB — LIPASE: Lipase: 20 U/L (ref 7–60)

## 2022-06-25 NOTE — Telephone Encounter (Signed)
Call by this RN to see how she was today. No questions or concerns

## 2022-07-30 DIAGNOSIS — R42 Dizziness and giddiness: Secondary | ICD-10-CM | POA: Diagnosis not present

## 2022-07-30 DIAGNOSIS — R202 Paresthesia of skin: Secondary | ICD-10-CM | POA: Diagnosis not present

## 2022-07-31 DIAGNOSIS — R42 Dizziness and giddiness: Secondary | ICD-10-CM | POA: Diagnosis not present

## 2022-08-07 NOTE — Progress Notes (Deleted)
   Amber Sweeney D.[facility] Phone: [phone]   Assessment and Plan:     There are no diagnoses linked to this encounter.  *** - Patient has received significant relief with OMT in the past.  Elects for repeat OMT today.  Tolerated well per note below. - Decision today to treat with OMT was based on Physical Exam   After verbal consent patient was treated with HVLA (high velocity low amplitude), ME (muscle energy), FPR (flex positional release), ST (soft tissue), PC/PD (Pelvic Compression/ Pelvic Decompression) techniques in cervical, rib, thoracic, lumbar, and pelvic areas. Patient tolerated the procedure well with improvement in symptoms.  Patient educated on potential side effects of soreness and recommended to rest, hydrate, and use Tylenol as needed for pain control.   Pertinent previous records reviewed include ***   Follow Up: ***     Subjective:   I, Amber Sweeney, am serving as a Education administrator for Amber Sweeney  Chief Complaint: OMT  HPI:   08/08/2022 Patient is a 41 year old female complaining pain. Patient states  Relevant Historical Information: ***  Additional pertinent review of systems negative.  Current Outpatient Medications  Medication Sig Dispense Refill   Cholecalciferol (VITAMIN D3) 1.25 MG (50000 UT) CAPS Take 1 capsule by mouth every 30 (thirty) days. 3 capsule 1   Cholecalciferol 1.25 MG (50000 UT) capsule Take 1 capsule by mouth every 30 (thirty) days. 3 capsule 1   clonazePAM (KLONOPIN) 0.5 MG tablet TAKE 1 TO 2 TABLETS BY MOUTH TWICE DAILY FOR ANXIETY 120 tablet 0   FLUoxetine (PROZAC) 20 MG capsule TAKE 1 CAPSULE BY MOUTH EVERY DAY IN THE MORNING (Patient taking differently: Take 20 mg by mouth in the morning.) 90 capsule 1   omeprazole (PRILOSEC) 40 MG capsule Take 1 capsule (40 mg total) by mouth daily. 30 capsule 0   Semaglutide-Weight Management (WEGOVY) 2.4 MG/0.75ML SOAJ  Inject (2.4MG ) into the skin every week on the same day of each week (Patient taking differently: Inject 2.4 mg into the skin once a week.) 12 mL 1   sucralfate (CARAFATE) 1 GM/10ML suspension Take 10 mLs (1 g total) by mouth 4 (four) times daily -  with meals and at bedtime. 420 mL 0   thyroid (NP THYROID) 15 MG tablet TAKE 1 TABLET BY MOUTH FIRST THING IN THE MORNING ON AN EMPTY STOMACH 90 tablet 1   valACYclovir (VALTREX) 1000 MG tablet Take 1 tablet by mouth 3 (three) times daily for 7 days 21 tablet 2   No current facility-administered medications for this visit.      Objective:     There were no vitals filed for this visit.    There is no height or weight on file to calculate BMI.    Physical Exam:     General: Well-appearing, cooperative, sitting comfortably in no acute distress.   OMT Physical Exam:  ASIS Compression Test: Positive Right Cervical: TTP paraspinal, *** Rib: Bilateral elevated first rib with TTP Thoracic: TTP paraspinal,*** Lumbar: TTP paraspinal,*** Pelvis: Right anterior innominate  Electronically signed by:  Amber Sweeney D.[facility] 11:39 AM 08/07/22

## 2022-08-08 ENCOUNTER — Ambulatory Visit: Payer: 59 | Admitting: Sports Medicine

## 2022-08-08 ENCOUNTER — Ambulatory Visit: Payer: 59 | Admitting: Family Medicine

## 2022-08-10 ENCOUNTER — Other Ambulatory Visit (HOSPITAL_COMMUNITY): Payer: Self-pay

## 2022-08-11 ENCOUNTER — Other Ambulatory Visit (HOSPITAL_COMMUNITY): Payer: Self-pay

## 2022-08-11 MED ORDER — FLUOXETINE HCL 20 MG PO CAPS
20.0000 mg | ORAL_CAPSULE | ORAL | 1 refills | Status: DC
  Filled 2022-08-11: qty 90, 90d supply, fill #0
  Filled 2022-11-09: qty 90, 90d supply, fill #1

## 2022-08-11 MED ORDER — CLONAZEPAM 0.5 MG PO TABS
0.5000 mg | ORAL_TABLET | Freq: Two times a day (BID) | ORAL | 0 refills | Status: DC
  Filled 2022-08-11: qty 120, 30d supply, fill #0

## 2022-08-14 ENCOUNTER — Encounter (HOSPITAL_COMMUNITY): Payer: Self-pay

## 2022-08-14 ENCOUNTER — Other Ambulatory Visit (HOSPITAL_COMMUNITY): Payer: Self-pay

## 2022-08-25 ENCOUNTER — Other Ambulatory Visit (HOSPITAL_COMMUNITY): Payer: Self-pay

## 2022-08-29 ENCOUNTER — Other Ambulatory Visit (HOSPITAL_COMMUNITY): Payer: Self-pay

## 2022-09-04 ENCOUNTER — Encounter: Payer: Self-pay | Admitting: Family

## 2022-09-04 ENCOUNTER — Other Ambulatory Visit (HOSPITAL_COMMUNITY): Payer: Self-pay

## 2022-09-04 MED ORDER — CLONAZEPAM 0.5 MG PO TABS
0.5000 mg | ORAL_TABLET | Freq: Two times a day (BID) | ORAL | 0 refills | Status: DC
  Filled 2022-09-04 – 2022-09-27 (×2): qty 120, 30d supply, fill #0

## 2022-09-11 ENCOUNTER — Encounter: Payer: Self-pay | Admitting: Family Medicine

## 2022-09-12 ENCOUNTER — Other Ambulatory Visit: Payer: Self-pay | Admitting: Family Medicine

## 2022-09-12 DIAGNOSIS — N644 Mastodynia: Secondary | ICD-10-CM

## 2022-09-12 DIAGNOSIS — Z1501 Genetic susceptibility to malignant neoplasm of breast: Secondary | ICD-10-CM

## 2022-09-19 ENCOUNTER — Other Ambulatory Visit: Payer: Self-pay | Admitting: Family Medicine

## 2022-09-19 DIAGNOSIS — Z1501 Genetic susceptibility to malignant neoplasm of breast: Secondary | ICD-10-CM

## 2022-09-19 DIAGNOSIS — N644 Mastodynia: Secondary | ICD-10-CM

## 2022-09-21 ENCOUNTER — Encounter: Payer: Self-pay | Admitting: Family

## 2022-09-22 ENCOUNTER — Other Ambulatory Visit: Payer: 59

## 2022-09-22 ENCOUNTER — Ambulatory Visit: Payer: 59 | Admitting: Family

## 2022-09-23 ENCOUNTER — Encounter: Payer: Self-pay | Admitting: Family

## 2022-09-26 ENCOUNTER — Encounter: Payer: Self-pay | Admitting: Family

## 2022-09-27 ENCOUNTER — Other Ambulatory Visit (HOSPITAL_COMMUNITY): Payer: Self-pay

## 2022-09-29 ENCOUNTER — Other Ambulatory Visit (HOSPITAL_COMMUNITY): Payer: Self-pay

## 2022-10-03 ENCOUNTER — Encounter: Payer: Self-pay | Admitting: Family

## 2022-10-10 ENCOUNTER — Inpatient Hospital Stay: Payer: Commercial Managed Care - PPO

## 2022-10-10 ENCOUNTER — Inpatient Hospital Stay: Payer: Commercial Managed Care - PPO | Admitting: Family

## 2022-10-25 ENCOUNTER — Encounter: Payer: Self-pay | Admitting: Family

## 2022-10-25 ENCOUNTER — Telehealth (INDEPENDENT_AMBULATORY_CARE_PROVIDER_SITE_OTHER): Payer: 59 | Admitting: Family

## 2022-10-25 DIAGNOSIS — J069 Acute upper respiratory infection, unspecified: Secondary | ICD-10-CM

## 2022-10-25 MED ORDER — METOCLOPRAMIDE HCL 10 MG PO TABS: 10.0000 mg | ORAL_TABLET | Freq: Three times a day (TID) | ORAL | 0 refills | Status: AC | PRN

## 2022-10-25 NOTE — Progress Notes (Addendum)
MyChart Video Visit    Virtual Visit via Video Note   This visit type was conducted due to national recommendations for restrictions regarding the COVID-19 Pandemic (e.g. social distancing) in an effort to limit this patient's exposure and mitigate transmission in our community. This patient is at least at moderate risk for complications without adequate follow up. This format is felt to be most appropriate for this patient at this time. Physical exam was limited by quality of the video and audio technology used for the visit. CMA was able to get the patient set up on a video visit.  Patient location: Home Patient and provider in visit Provider location: Office  I was able to see the patient but we did not have any audio, so we transitioned to a telephone visit.   I discussed the limitations of evaluation and management by telemedicine and the availability of in person appointments. The patient expressed understanding and agreed to proceed.  Visit Date: 10/25/2022  Today's healthcare provider: Nance Pear, NP     Subjective:    Patient ID: Amber Sweeney, female    DOB: [birth year], 42 y.o.   MRN: [id number]  Chief Complaint  Patient presents with   Fever    Patient reports having a fever that started yesterday, tested negative for covid yesterday and today   Headache    Complains of headache since yesterday    Fever  Associated symptoms include headaches.  Headache  Associated symptoms include a fever.   Patient is in today for a video visit.   She complains of fever. She developed a headache yesterday morning and later developed nausea around the afternoon. She currently has temperature of 101.3 degrees F. She continues having a headache at this time. She is taking benadryl to manage her headaches. She has also taken motrin and found no change in her headaches. She has taken 2 Covid-19 tests and tested negative for both. She is UTD on flu vaccine this year.     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   Hashimoto's thyroiditis    Hemochromatosis     Past Surgical History:  Procedure Laterality Date   CHOLECYSTECTOMY     Ovaries and tubes removed     WISDOM TOOTH EXTRACTION      Family History  Problem Relation Age of Onset   Valvular heart disease Mother        Hx valve replacement   BRCA 1/2 Mother    Diabetes Father    Hashimoto's thyroiditis Sister    BRCA 1/2 Sister    Anemia Sister    Diabetes Brother    Breast cancer Maternal Aunt     Social History   Socioeconomic History   Marital status: Single    Spouse name: Not on file   Number of children: Not on file   Years of education: Not on file   Highest education level: Not on file  Occupational History   Not on file  Tobacco Use   Smoking status: Former    Types: Cigarettes    Quit date: 10/10/2018    Years since quitting: 4.0    Passive exposure: Past   Smokeless tobacco: Never   Tobacco comments:    Quit smoking 10/2018  Vaping Use   Vaping Use: Every day   Start date: 10/09/2018  Substance and Sexual Activity   Alcohol use: Not Currently   Drug use: Not Currently  Sexual activity: Not on file  Other Topics Concern   Not on file  Social History Narrative   Not on file   Social Determinants of Health   Financial Resource Strain: Not on file  Food Insecurity: Not on file  Transportation Needs: Not on file  Physical Activity: Not on file  Stress: Not on file  Social Connections: Not on file  Intimate Partner Violence: Not on file    Outpatient Medications Prior to Visit  Medication Sig Dispense Refill   Cholecalciferol (VITAMIN D3) 1.25 MG (50000 UT) CAPS Take 1 capsule by mouth every 30 (thirty) days. 3 capsule 1   clonazePAM (KLONOPIN) 0.5 MG tablet Take 1-2 tablets (0.5-1 mg total) by mouth 2 (two) times daily for anxiety 120 tablet 0   FLUoxetine (PROZAC) 20 MG capsule Take 1 capsule (20 mg total) by  mouth every morning. 90 capsule 1   thyroid (NP THYROID) 15 MG tablet TAKE 1 TABLET BY MOUTH FIRST THING IN THE MORNING ON AN EMPTY STOMACH 90 tablet 1   valACYclovir (VALTREX) 1000 MG tablet Take 1 tablet by mouth 3 (three) times daily for 7 days 21 tablet 2   Semaglutide-Weight Management (WEGOVY) 2.4 MG/0.75ML SOAJ Inject (2.4MG ) into the skin every week on the same day of each week (Patient taking differently: Inject 2.4 mg into the skin once a week.) 12 mL 1   Cholecalciferol 1.25 MG (50000 UT) capsule Take 1 capsule by mouth every 30 (thirty) days. 3 capsule 1   omeprazole (PRILOSEC) 40 MG capsule Take 1 capsule (40 mg total) by mouth daily. 30 capsule 0   sucralfate (CARAFATE) 1 GM/10ML suspension Take 10 mLs (1 g total) by mouth 4 (four) times daily -  with meals and at bedtime. 420 mL 0   No facility-administered medications prior to visit.    Allergies  Allergen Reactions   Iodinated Contrast Media Other (See Comments)    Panic Attack   Cephalosporins Rash   Cyclosporine Hives   Celecoxib Other (See Comments)    Patient stated it gave her pancreatitis   Gabapentin Palpitations   Levothyroxine Sodium Palpitations   Liraglutide -Weight Management Other (See Comments)    Injection site turned black for months   Pregabalin Diarrhea    Panic attack     Wellbutrin [Bupropion] Other (See Comments)    "I didn't care about anything"    Review of Systems  Constitutional:  Positive for fever.  Neurological:  Positive for headaches.       Objective:    Physical Exam  There were no vitals taken for this visit. Wt Readings from Last 3 Encounters:  06/24/22 176 lb (79.8 kg)  04/05/22 178 lb 6.4 oz (80.9 kg)  03/01/22 182 lb (82.6 kg)    Gen: Awake, alert, sick appearing- laying in bed Resp: Breathing is even and non-labored Psych: calm/pleasant demeanor Neuro: Alert and Oriented x 3, + facial symmetry, speech is clear.     Assessment & Plan:  Viral upper respiratory  illness Assessment & Plan: Suspect flu.  We discussed empiric tamiflu- pt declines.  She is most bothered by the headaches.  States that she usually treats these headaches with reglan and benadryl- however her reglan is out of date.  Refill sent to her pharmacy.  Discussed supportive measures and advised pt to schedule in person visit if symptoms worsen or if symptoms fail to improve.    Other orders -     Metoclopramide HCl; Take 1 tablet (10  mg total) by mouth every 8 (eight) hours as needed for nausea.  Dispense: 30 tablet; Refill: 0    7 minutes spent on today's visit.   I discussed the assessment and treatment plan with the patient. The patient was provided an opportunity to ask questions and all were answered. The patient agreed with the plan and demonstrated an understanding of the instructions.   The patient was advised to call back or seek an in-person evaluation if the symptoms worsen or if the condition fails to improve as anticipated.  Nance Pear, NP [employer] [facility] [phone] (phone) [phone] (fax)  [place]    I,Shehryar Baig,acting as a scribe for Nance Pear, NP.,have documented all relevant documentation on the behalf of Nance Pear, NP,as directed by  Nance Pear, NP while in the presence of Nance Pear, NP.

## 2022-10-26 DIAGNOSIS — J069 Acute upper respiratory infection, unspecified: Secondary | ICD-10-CM | POA: Insufficient documentation

## 2022-10-26 NOTE — Assessment & Plan Note (Signed)
Suspect flu.  We discussed empiric tamiflu- pt declines.  She is most bothered by the headaches.  States that she usually treats these headaches with reglan and benadryl- however her reglan is out of date.  Refill sent to her pharmacy.  Discussed supportive measures and advised pt to schedule in person visit if symptoms worsen or if symptoms fail to improve.

## 2022-10-30 ENCOUNTER — Encounter: Payer: Self-pay | Admitting: Family

## 2022-10-31 ENCOUNTER — Encounter: Payer: Self-pay | Admitting: Family

## 2022-10-31 ENCOUNTER — Inpatient Hospital Stay: Payer: 59 | Admitting: Family

## 2022-10-31 ENCOUNTER — Inpatient Hospital Stay: Payer: 59 | Attending: Hematology & Oncology

## 2022-10-31 DIAGNOSIS — R002 Palpitations: Secondary | ICD-10-CM | POA: Diagnosis not present

## 2022-10-31 LAB — CBC WITH DIFFERENTIAL (CANCER CENTER ONLY)
Abs Immature Granulocytes: 0.02 10*3/uL (ref 0.00–0.07)
Basophils Absolute: 0.1 10*3/uL (ref 0.0–0.1)
Basophils Relative: 1 %
Eosinophils Absolute: 0.2 10*3/uL (ref 0.0–0.5)
Eosinophils Relative: 2 %
HCT: 39.5 % (ref 36.0–46.0)
Hemoglobin: 13.4 g/dL (ref 12.0–15.0)
Immature Granulocytes: 0 %
Lymphocytes Relative: 37 %
Lymphs Abs: 2.7 10*3/uL (ref 0.7–4.0)
MCH: 32.4 pg (ref 26.0–34.0)
MCHC: 33.9 g/dL (ref 30.0–36.0)
MCV: 95.4 fL (ref 80.0–100.0)
Monocytes Absolute: 0.4 10*3/uL (ref 0.1–1.0)
Monocytes Relative: 5 %
Neutro Abs: 3.8 10*3/uL (ref 1.7–7.7)
Neutrophils Relative %: 55 %
Platelet Count: 248 10*3/uL (ref 150–400)
RBC: 4.14 MIL/uL (ref 3.87–5.11)
RDW: 12.4 % (ref 11.5–15.5)
WBC Count: 7.1 10*3/uL (ref 4.0–10.5)
nRBC: 0 % (ref 0.0–0.2)

## 2022-10-31 LAB — CMP (CANCER CENTER ONLY)
ALT: 15 U/L (ref 0–44)
AST: 16 U/L (ref 15–41)
Albumin: 4.6 g/dL (ref 3.5–5.0)
Alkaline Phosphatase: 55 U/L (ref 38–126)
Anion gap: 10 (ref 5–15)
BUN: 17 mg/dL (ref 6–20)
CO2: 29 mmol/L (ref 22–32)
Calcium: 9.7 mg/dL (ref 8.9–10.3)
Chloride: 103 mmol/L (ref 98–111)
Creatinine: 0.87 mg/dL (ref 0.44–1.00)
GFR, Estimated: >60 mL/min (ref >60)
Glucose, Bld: 96 mg/dL (ref 70–99)
Potassium: 4.3 mmol/L (ref 3.5–5.1)
Sodium: 142 mmol/L (ref 135–145)
Total Bilirubin: 0.5 mg/dL (ref 0.3–1.2)
Total Protein: 7 g/dL (ref 6.5–8.1)

## 2022-10-31 LAB — IRON AND IRON BINDING CAPACITY (CC-WL,HP ONLY)
Iron: 189 ug/dL — ABNORMAL HIGH (ref 28–170)
Saturation Ratios: 67 % — ABNORMAL HIGH (ref 10.4–31.8)
TIBC: 281 ug/dL (ref 250–450)
UIBC: 92 ug/dL — ABNORMAL LOW (ref 148–442)

## 2022-10-31 LAB — FERRITIN: Ferritin: 99 ng/mL (ref 11–307)

## 2022-10-31 NOTE — Progress Notes (Signed)
Hematology and Oncology Follow Up Visit  Amber Sweeney [id number] 1981/01/09 42 y.o. 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 is here today for follow-up. She has not donated blood in several months and has noted an increase in headaches and joint aches and pains.  She has some hot flashes and night sweats.  No fever, chills, n/v, cough, rash, dizziness, SOB, abdominal pain or changes in bowel or bladder habits.  She has intermittent palpitations which she attributes to her thyroid.  No swelling, numbness or tingling in her extremities.  No falls or syncope.  Appetite and hydration are good. Weight is stable at 186 lbs.   ECOG Performance Status: 0 - Asymptomatic  Medications:  Allergies as of 10/31/2022       Reactions   Iodinated Contrast Media Other (See Comments)   Panic Attack   Cephalosporins Rash   Cyclosporine Hives   Celecoxib Other (See Comments)   Patient stated it gave her pancreatitis   Gabapentin Palpitations   Levothyroxine Sodium Palpitations   Liraglutide -weight Management Other (See Comments)   Injection site turned black for months   Pregabalin Diarrhea   Panic attack   Wellbutrin [bupropion] Other (See Comments)   "I didn't care about anything"        Medication List        Accurate as of October 31, 2022 12:53 PM. If you have any questions, ask your nurse or doctor.          clonazePAM 0.5 MG tablet Commonly known as: KLONOPIN Take 1-2 tablets (0.5-1 mg total) by mouth 2 (two) times daily for anxiety   FLUoxetine 20 MG capsule Commonly known as: PROzac Take 1 capsule (20 mg total) by mouth every morning.   metoCLOPramide 10 MG tablet Commonly known as: REGLAN Take 1 tablet (10 mg total) by mouth every 8 (eight) hours as needed for nausea.   NP Thyroid 15 MG  tablet Generic drug: thyroid TAKE 1 TABLET BY MOUTH FIRST THING IN THE MORNING ON AN EMPTY STOMACH   valACYclovir 1000 MG tablet Commonly known as: Valtrex Take 1 tablet by mouth 3 (three) times daily for 7 days   Vitamin D3 1.25 MG (50000 UT) Caps Take 1 capsule by mouth every 30 (thirty) days.        Allergies:  Allergies  Allergen Reactions   Iodinated Contrast Media Other (See Comments)    Panic Attack   Cephalosporins Rash   Cyclosporine Hives   Celecoxib Other (See Comments)    Patient stated it gave her pancreatitis   Gabapentin Palpitations   Levothyroxine Sodium Palpitations   Liraglutide -Weight Management Other (See Comments)    Injection site turned black for months   Pregabalin Diarrhea    Panic attack     Wellbutrin [Bupropion] Other (See Comments)    "I didn't care about anything"    Past Medical History, Surgical history, Social history, and Family History were reviewed and updated.  Review of Systems: All other 10 point review of systems is negative.   Physical Exam:  vitals were not taken for this visit.   Wt Readings from Last 3 Encounters:  06/24/22 176 lb (79.8 kg)  04/05/22 178 lb 6.4 oz (80.9 kg)  03/01/22 182 lb (82.6 kg)    Ocular: Sclerae unicteric,  pupils equal, round and reactive to light Ear-nose-throat: Oropharynx clear, dentition fair Lymphatic: No cervical or supraclavicular adenopathy Lungs no rales or rhonchi, good excursion bilaterally Heart regular rate and rhythm, no murmur appreciated Abd soft, nontender, positive bowel sounds MSK no focal spinal tenderness, no joint edema Neuro: non-focal, well-oriented, appropriate affect Breasts: Deferred   Lab Results  Component Value Date   WBC 7.1 10/31/2022   HGB 13.4 10/31/2022   HCT 39.5 10/31/2022   MCV 95.4 10/31/2022   PLT 248 10/31/2022   Lab Results  Component Value Date   FERRITIN 249 04/05/2022   IRON 201 (H) 04/05/2022   TIBC 239 (L) 04/05/2022   UIBC 38  (L) 04/05/2022   IRONPCTSAT 84 (H) 04/05/2022   Lab Results  Component Value Date   RBC 4.14 10/31/2022   No results found for: "KPAFRELGTCHN", "LAMBDASER", "KAPLAMBRATIO" No results found for: "IGGSERUM", "IGA", "IGMSERUM" No results found for: "TOTALPROTELP", "ALBUMINELP", "A1GS", "A2GS", "BETS", "BETA2SER", "GAMS", "MSPIKE", "SPEI"   Chemistry      Component Value Date/Time   NA 139 06/24/2022 1522   K 4.4 06/24/2022 1522   CL 106 06/24/2022 1522   CO2 28 06/24/2022 1522   BUN 12 06/24/2022 1522   CREATININE 0.75 06/24/2022 1522      Component Value Date/Time   CALCIUM 9.0 06/24/2022 1522   ALKPHOS 49 04/05/2022 1332   AST 13 06/24/2022 1522   AST 14 (L) 04/05/2022 1332   ALT 11 06/24/2022 1522   ALT 18 04/05/2022 1332   BILITOT 0.3 06/24/2022 1522   BILITOT 0.7 04/05/2022 1332       Impression and Plan: [name] is a very pleasant 42 yo caucasian female with history of hemochromatosis, homozygous for the C282Y mutation.  Iron studies are pending. She enjoys donating with the [employer] as needed. She is also agreeable to going to [facility] for more frequent donation if needed.  Follow-up in 3 months.   Amber Dawson, NP 1/23/202412:53 PM

## 2022-11-09 ENCOUNTER — Other Ambulatory Visit (HOSPITAL_COMMUNITY): Payer: Self-pay

## 2022-11-09 ENCOUNTER — Other Ambulatory Visit: Payer: Self-pay

## 2022-11-10 ENCOUNTER — Other Ambulatory Visit (HOSPITAL_COMMUNITY): Payer: Self-pay

## 2022-11-10 ENCOUNTER — Inpatient Hospital Stay: Admission: RE | Admit: 2022-11-10 | Payer: Self-pay | Source: Ambulatory Visit

## 2022-11-10 MED ORDER — CLONAZEPAM 0.5 MG PO TABS
0.5000 mg | ORAL_TABLET | Freq: Two times a day (BID) | ORAL | 0 refills | Status: DC
  Filled 2022-11-10 – 2022-11-13 (×2): qty 120, 30d supply, fill #0

## 2022-11-13 ENCOUNTER — Other Ambulatory Visit (HOSPITAL_COMMUNITY): Payer: Self-pay

## 2022-11-13 ENCOUNTER — Other Ambulatory Visit: Payer: Self-pay

## 2022-11-18 ENCOUNTER — Encounter: Payer: Self-pay | Admitting: Family Medicine

## 2022-11-18 DIAGNOSIS — E669 Obesity, unspecified: Secondary | ICD-10-CM

## 2022-11-20 ENCOUNTER — Other Ambulatory Visit (HOSPITAL_COMMUNITY): Payer: Self-pay

## 2022-11-20 MED ORDER — WEGOVY 2.4 MG/0.75ML ~~LOC~~ SOAJ
2.4000 mg | SUBCUTANEOUS | 2 refills | Status: DC
  Filled 2022-11-20 – 2022-11-23 (×2): qty 3, 28d supply, fill #0
  Filled 2022-12-23: qty 3, 28d supply, fill #1

## 2022-11-20 NOTE — Addendum Note (Signed)
Addended by: Caleen Jobs B on: 11/20/2022 01:15 PM   Modules accepted: Orders

## 2022-11-23 ENCOUNTER — Other Ambulatory Visit (HOSPITAL_COMMUNITY): Payer: Self-pay

## 2022-11-23 ENCOUNTER — Telehealth: Payer: Self-pay

## 2022-11-23 ENCOUNTER — Encounter: Payer: Self-pay | Admitting: Family

## 2022-11-23 NOTE — Telephone Encounter (Signed)
PA initiated via Covermymeds; KEY: [id number]. Awaiting determination.

## 2022-11-24 NOTE — Telephone Encounter (Signed)
PA approved.   The request has been approved. The authorization is effective from 11/24/2022 to 11/24/2023, as long as the member is enrolled in their current health plan.. Authorization Expiration Date: November 24, 2023.

## 2022-11-30 ENCOUNTER — Other Ambulatory Visit: Payer: Self-pay

## 2022-11-30 ENCOUNTER — Other Ambulatory Visit (HOSPITAL_COMMUNITY): Payer: Self-pay

## 2022-12-08 DIAGNOSIS — F419 Anxiety disorder, unspecified: Secondary | ICD-10-CM | POA: Diagnosis not present

## 2022-12-20 DIAGNOSIS — F4312 Post-traumatic stress disorder, chronic: Secondary | ICD-10-CM | POA: Diagnosis not present

## 2022-12-25 ENCOUNTER — Other Ambulatory Visit: Payer: Self-pay

## 2023-01-04 ENCOUNTER — Encounter: Payer: Self-pay | Admitting: Family

## 2023-01-04 ENCOUNTER — Other Ambulatory Visit: Payer: Self-pay

## 2023-01-04 ENCOUNTER — Other Ambulatory Visit (HOSPITAL_COMMUNITY): Payer: Self-pay

## 2023-01-05 DIAGNOSIS — F4312 Post-traumatic stress disorder, chronic: Secondary | ICD-10-CM | POA: Diagnosis not present

## 2023-01-23 ENCOUNTER — Encounter: Payer: Self-pay | Admitting: Family

## 2023-01-29 ENCOUNTER — Encounter: Payer: Self-pay | Admitting: Family Medicine

## 2023-01-29 ENCOUNTER — Ambulatory Visit (INDEPENDENT_AMBULATORY_CARE_PROVIDER_SITE_OTHER): Payer: 59 | Admitting: Family Medicine

## 2023-01-29 VITALS — BP 115/73 | HR 77 | Ht 68.0 in | Wt 193.0 lb

## 2023-01-29 DIAGNOSIS — Z0000 Encounter for general adult medical examination without abnormal findings: Secondary | ICD-10-CM | POA: Diagnosis not present

## 2023-01-29 DIAGNOSIS — E063 Autoimmune thyroiditis: Secondary | ICD-10-CM

## 2023-01-29 NOTE — Progress Notes (Signed)
Complete physical exam  Patient: Amber Sweeney   DOB: 22-Jul-1981   42 y.o. Female  MRN: [id number]  Subjective:    Chief Complaint  Patient presents with   Annual Exam    Amber Sweeney is a 42 y.o. female who presents today for a complete physical exam. She reports consuming a general diet. The patient does not participate in regular exercise at present. She generally feels well. She reports sleeping poorly. She does not have additional problems to discuss today.   Currently lives with: her young daughters  Interim Problems from her last visit: no   Vision concerns: none Dental concerns: none  Patient none ETOH use  Patient no nicotine use. Patient denies illegal substance use.   She is not currently sexually active    Chronic conditions; - She is following with hematology for hereditary hemochromatosis (therapeutic phlebotomy - donates with [facility]-  goal of  iron sat <50% and ferritin <100.   Most recent fall risk assessment:    01/29/2023    1:51 PM  Fall Risk   Falls in the past year? 0  Number falls in past yr: 0  Injury with Fall? 0  Risk for fall due to : No Fall Risks  Follow up Falls evaluation completed     Most recent depression screenings:    01/29/2023    2:09 PM 01/25/2022    8:34 AM  PHQ 2/9 Scores  PHQ - 2 Score 0 0  PHQ- 9 Score 7   - Not currently interested in medication changes or counseling. No SI/HI.       Patient Care Team: Clayborne Dana, NP as PCP - General (Family Medicine)   Outpatient Medications Prior to Visit  Medication Sig   Cholecalciferol (VITAMIN D3) 1.25 MG (50000 UT) CAPS Take 1 capsule by mouth every 30 (thirty) days.   clonazePAM (KLONOPIN) 0.5 MG tablet Take 1-2 tablets (0.5-1 mg total) by mouth 2 (two) times daily for anxiety.   FLUoxetine (PROZAC) 20 MG capsule Take 1 capsule (20 mg total) by mouth every morning.   metoCLOPramide (REGLAN) 10 MG tablet Take 1 tablet (10 mg total) by mouth every 8 (eight)  hours as needed for nausea.   PARoxetine (PAXIL) 30 MG tablet Take 30 mg by mouth daily.   thyroid (NP THYROID) 15 MG tablet TAKE 1 TABLET BY MOUTH FIRST THING IN THE MORNING ON AN EMPTY STOMACH   valACYclovir (VALTREX) 1000 MG tablet Take 1 tablet by mouth 3 (three) times daily for 7 days   [DISCONTINUED] LORazepam (ATIVAN) 0.5 MG tablet Take 2 tablets 3 times a day by oral route.   [DISCONTINUED] Semaglutide-Weight Management (WEGOVY) 2.4 MG/0.75ML SOAJ Inject 2.4 mg into the skin once a week.   No facility-administered medications prior to visit.    ROS All review of systems negative except what is listed in the HPI        Objective:     BP 115/73   Pulse 77   Ht  (1.727 m)   Wt 193 lb (87.5 kg)   SpO2 97%   BMI 29.35 kg/m     Physical Exam Vitals reviewed.  Constitutional:      General: She is not in acute distress.    Appearance: Normal appearance. She is not ill-appearing.  HENT:     Head: Normocephalic and atraumatic.     Right Ear: Tympanic membrane normal.     Left Ear: Tympanic membrane normal.  Nose: Nose normal.     Mouth/Throat:     Mouth: Mucous membranes are moist.     Pharynx: Oropharynx is clear.  Eyes:     Extraocular Movements: Extraocular movements intact.     Conjunctiva/sclera: Conjunctivae normal.     Pupils: Pupils are equal, round, and reactive to light.  Neck:     Vascular: No carotid bruit.  Cardiovascular:     Rate and Rhythm: Normal rate and regular rhythm.     Pulses: Normal pulses.     Heart sounds: Normal heart sounds.  Pulmonary:     Effort: Pulmonary effort is normal.     Breath sounds: Normal breath sounds.  Abdominal:     General: Abdomen is flat. Bowel sounds are normal. There is no distension.     Palpations: Abdomen is soft. There is no mass.     Tenderness: There is no abdominal tenderness. There is no right CVA tenderness, left CVA tenderness, guarding or rebound.  Genitourinary:    Comments: Deferred  exam Musculoskeletal:        General: Normal range of motion.     Cervical back: Normal range of motion and neck supple. No tenderness.     Right lower leg: No edema.     Left lower leg: No edema.  Lymphadenopathy:     Cervical: No cervical adenopathy.  Skin:    General: Skin is warm and dry.     Capillary Refill: Capillary refill takes less than 2 seconds.  Neurological:     General: No focal deficit present.     Mental Status: She is alert and oriented to person, place, and time. Mental status is at baseline.  Psychiatric:        Mood and Affect: Mood normal.        Behavior: Behavior normal.        Thought Content: Thought content normal.        Judgment: Judgment normal.           No results found for any visits on 01/29/23.      Assessment & Plan:    Routine Health Maintenance and Physical Exam Discussed health promotion and safety including diet and exercise recommendations, dental health, and injury prevention. Tobacco cessation if applicable. Seat belts, sunscreen, smoke detectors, etc.    Immunization History  Administered Date(s) Administered   Influenza Split 07/16/2012   PFIZER(Purple Top)SARS-COV-2 Vaccination 10/29/2019, 12/25/2019   PPD Test 05/28/2013   Tdap 03/09/2011, 01/07/2018    Health Maintenance  Topic Date Due   COVID-19 Vaccine (3 - 2023-24 season) 01/29/2024 (Originally 06/09/2022)   INFLUENZA VACCINE  05/10/2023   PAP SMEAR-Modifier  01/26/2027   DTaP/Tdap/Td (3 - Td or Tdap) 01/08/2028   Hepatitis C Screening  Completed   HIV Screening  Completed   HPV VACCINES  Aged Out        Problem List Items Addressed This Visit     Hereditary hemochromatosis   Relevant Orders   CBC with Differential/Platelet   Comprehensive metabolic panel   Iron, TIBC and Ferritin Panel   Hashimoto's thyroiditis - Primary   Relevant Orders   T3, free   T4, free   Other Visit Diagnoses     Annual physical exam       Relevant Orders   CBC with  Differential/Platelet   Comprehensive metabolic panel   Lipid panel   TSH   HgB A1c      Return in about 1 year (around 01/29/2024) for  physical.     Clayborne Dana, NP

## 2023-01-30 ENCOUNTER — Inpatient Hospital Stay: Payer: 59 | Admitting: Medical Oncology

## 2023-01-30 ENCOUNTER — Other Ambulatory Visit: Payer: 59

## 2023-01-30 LAB — COMPREHENSIVE METABOLIC PANEL
ALT: 20 U/L (ref 0–35)
AST: 16 U/L (ref 0–37)
Albumin: 4.4 g/dL (ref 3.5–5.2)
Alkaline Phosphatase: 57 U/L (ref 39–117)
BUN: 16 mg/dL (ref 6–23)
CO2: 29 mEq/L (ref 19–32)
Calcium: 9.3 mg/dL (ref 8.4–10.5)
Chloride: 101 mEq/L (ref 96–112)
Creatinine, Ser: 0.78 mg/dL (ref 0.40–1.20)
GFR: 93.7 mL/min (ref >60.00)
Glucose, Bld: 77 mg/dL (ref 70–99)
Potassium: 4.2 mEq/L (ref 3.5–5.1)
Sodium: 138 mEq/L (ref 135–145)
Total Bilirubin: 0.4 mg/dL (ref 0.2–1.2)
Total Protein: 6.7 g/dL (ref 6.0–8.3)

## 2023-01-30 LAB — T4, FREE: Free T4: 0.65 ng/dL (ref 0.60–1.60)

## 2023-01-30 LAB — CBC WITH DIFFERENTIAL/PLATELET
Basophils Absolute: 0.1 10*3/uL (ref 0.0–0.1)
Basophils Relative: 0.9 % (ref 0.0–3.0)
Eosinophils Absolute: 0.1 10*3/uL (ref 0.0–0.7)
Eosinophils Relative: 1.8 % (ref 0.0–5.0)
HCT: 38.3 % (ref 36.0–46.0)
Hemoglobin: 13.1 g/dL (ref 12.0–15.0)
Lymphocytes Relative: 30.5 % (ref 12.0–46.0)
Lymphs Abs: 2.5 10*3/uL (ref 0.7–4.0)
MCHC: 34.1 g/dL (ref 30.0–36.0)
MCV: 97.1 fl (ref 78.0–100.0)
Monocytes Absolute: 0.4 10*3/uL (ref 0.1–1.0)
Monocytes Relative: 5.1 % (ref 3.0–12.0)
Neutro Abs: 5 10*3/uL (ref 1.4–7.7)
Neutrophils Relative %: 61.7 % (ref 43.0–77.0)
Platelets: 270 10*3/uL (ref 150.0–400.0)
RBC: 3.94 Mil/uL (ref 3.87–5.11)
RDW: 13.5 % (ref 11.5–15.5)
WBC: 8.1 10*3/uL (ref 4.0–10.5)

## 2023-01-30 LAB — LIPID PANEL
Cholesterol: 199 mg/dL (ref 0–200)
HDL: 53.5 mg/dL (ref >39.00)
LDL Cholesterol: 120 mg/dL — ABNORMAL HIGH (ref 0–99)
NonHDL: 145.12
Total CHOL/HDL Ratio: 4
Triglycerides: 126 mg/dL (ref 0.0–149.0)
VLDL: 25.2 mg/dL (ref 0.0–40.0)

## 2023-01-30 LAB — TSH: TSH: 2.09 u[IU]/mL (ref 0.35–5.50)

## 2023-01-30 LAB — IRON,TIBC AND FERRITIN PANEL
%SAT: 36 % (calc) (ref 16–45)
Ferritin: 50 ng/mL (ref 16–232)
Iron: 107 ug/dL (ref 40–190)
TIBC: 295 mcg/dL (calc) (ref 250–450)

## 2023-01-30 LAB — HEMOGLOBIN A1C: Hgb A1c MFr Bld: 4.8 % (ref 4.6–6.5)

## 2023-01-30 LAB — T3, FREE: T3, Free: 2.9 pg/mL (ref 2.3–4.2)

## 2023-02-07 ENCOUNTER — Encounter: Payer: Self-pay | Admitting: Family Medicine

## 2023-02-07 ENCOUNTER — Telehealth (INDEPENDENT_AMBULATORY_CARE_PROVIDER_SITE_OTHER): Payer: 59 | Admitting: Family Medicine

## 2023-02-07 DIAGNOSIS — G43909 Migraine, unspecified, not intractable, without status migrainosus: Secondary | ICD-10-CM | POA: Diagnosis not present

## 2023-02-07 MED ORDER — PREDNISONE 20 MG PO TABS
ORAL_TABLET | ORAL | 0 refills | Status: DC
Start: 2023-02-07 — End: 2023-02-12

## 2023-02-07 MED ORDER — PROMETHAZINE HCL 25 MG PO TABS
25.0000 mg | ORAL_TABLET | Freq: Three times a day (TID) | ORAL | 0 refills | Status: DC | PRN
Start: 2023-02-07 — End: 2023-02-12

## 2023-02-07 NOTE — Patient Instructions (Addendum)
Try tylenol, ibuprofen, etc (recommend not using excessively, no more than two days per week, to prevent rebound headaches).  Adding short prednisone taper and phenergan  Neurology referral given change in migraine history Review handout at end of AVS with migraine education Preventative measures: avoid triggers (chocolate, ETOH, some cheeses, MSG, artificial sweeteners, perfume, stress, too much/little sleep, hunger, etc); encourage exercise, good posture, sunglasses, blue light blocking glasses  Consider trying magnesium and riboflavin supplements to help prevent migraines   Please contact office for follow-up if symptoms do not improve or worsen. Seek emergency care if symptoms become severe.

## 2023-02-07 NOTE — Progress Notes (Signed)
Virtual Video Visit via MyChart Note  I connected with  Amber Sweeney on 02/07/23 at 11:20 AM EDT by the video enabled telemedicine application for MyChart, and verified that I am speaking with the correct person using two identifiers.   I introduced myself as a Publishing rights manager with the practice. We discussed the limitations of evaluation and management by telemedicine and the availability of in person appointments. The patient expressed understanding and agreed to proceed.  Participating parties in this visit include: The patient and the nurse practitioner listed.  The patient is: At home I am: In the office - [facility]  Subjective:    CC: migraine  HPI: Amber Sweeney is a 42 y.o. year old female presenting today via MyChart today for headache/migraine.  Amber Sweeney is currently experiencing a 7/10 migraine that started when she woke up this morning. Reports that pain is mostly posterior and she has had associated light sensitivity and nausea/vomiting. She has been having to lie down in a dark room most of the morning. She tried Zofran without any relief yet. Reports typically responds fine to OTC management and occasional benadryl plus Reglan. She states her migraines have started becoming more frequent the past 2-3 months, having a migraine about every 2 weeks causing her to miss work often. States she has never been on prophylactic meds. She has tried Imitrex in the past, but reports that made her headache significantly worse. She denies any chest pain, dyspnea, fevers, chills, loss of vision.         Past medical history, Surgical history, Family history not pertinant except as noted below, Social history, Allergies, and medications have been entered into the medical record, reviewed, and corrections made.   Review of Systems:  All review of systems negative except what is listed in the HPI   Objective:    General:  Speaking clearly in  complete sentences. Absent shortness of breath noted.   Alert and oriented x3.   Normal judgment.  Absent acute distress.   Impression and Recommendations:    1. Migraine without status migrainosus, not intractable, unspecified migraine type - predniSONE (DELTASONE) 20 MG tablet; Take 2 tablets (40 mg total) by mouth daily with breakfast for 2 days, THEN 1 tablet (20 mg total) daily with breakfast for 2 days, THEN 0.5 tablets (10 mg total) daily with breakfast for 2 days.  Dispense: 7 tablet; Refill: 0 - Ambulatory referral to Neurology - promethazine (PHENERGAN) 25 MG tablet; Take 1 tablet (25 mg total) by mouth every 8 (eight) hours as needed for nausea or vomiting.  Dispense: 20 tablet; Refill: 0  Try tylenol, ibuprofen, etc (recommend not using excessively, no more than two days per week, to prevent rebound headaches).  Adding short prednisone taper and phenergan (has worked well for her in the past) Neurology referral given change in migraine history Review handout at end of AVS with migraine education Preventative measures: avoid triggers (chocolate, ETOH, some cheeses, MSG, artificial sweeteners, perfume, stress, too much/little sleep, hunger, etc); encourage exercise, good posture, sunglasses, blue light blocking glasses  Consider trying magnesium and riboflavin supplements to help prevent migraines  Patient aware of signs/symptoms requiring further/urgent evaluation.    Follow-up if symptoms worsen or fail to improve.    I discussed the assessment and treatment plan with the patient. The patient was provided an opportunity to ask questions and all were answered. The patient agreed with the plan and demonstrated an understanding of the instructions.  The patient was advised to call back or seek an in-person evaluation if the symptoms worsen or if the condition fails to improve as anticipated.    Clayborne Dana, NP

## 2023-02-12 ENCOUNTER — Other Ambulatory Visit: Payer: Self-pay

## 2023-02-12 ENCOUNTER — Inpatient Hospital Stay: Payer: 59 | Attending: Family | Admitting: Family

## 2023-02-12 ENCOUNTER — Ambulatory Visit: Payer: 59 | Attending: Cardiology | Admitting: Cardiology

## 2023-02-12 ENCOUNTER — Encounter: Payer: Self-pay | Admitting: Family

## 2023-02-12 ENCOUNTER — Encounter: Payer: Self-pay | Admitting: Cardiology

## 2023-02-12 VITALS — BP 110/80 | HR 75 | Ht 68.0 in | Wt 191.0 lb

## 2023-02-12 DIAGNOSIS — R002 Palpitations: Secondary | ICD-10-CM

## 2023-02-12 DIAGNOSIS — F988 Other specified behavioral and emotional disorders with onset usually occurring in childhood and adolescence: Secondary | ICD-10-CM | POA: Diagnosis not present

## 2023-02-12 NOTE — Progress Notes (Signed)
Cardiology Office Note:    Date:  02/12/2023   ID:  Amber Sweeney, DOB Jun 07, 1981, MRN [id number]  PCP:  Amber Dana, NP  Cardiologist:  Amber Balsam, MD    Referring MD: Amber Dana, NP   Chief Complaint  Patient presents with   Dizziness   Hot Flashes   Palpitations   migraines     All ongoing for months     History of Present Illness:    Amber Sweeney is a 42 y.o. female with past medical history significant for chemo dermatosis, palpitations, history of Hashimoto thyroiditis however thyroid function is normal, palpitations previously diagnosed with PVCs.  She was requested to be seen because of tachycardia and palpitations.  She said anytime she tries to do something she walks she will get fast heartbeats.  Also gets some dizziness with that.  When she sits quietly usually no problem.  Denies having any passing out.  She does get tired while doing activities of daily living.  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   clonazePAM (KLONOPIN) 0.5 MG tablet Take 1-2 tablets (0.5-1 mg total) by mouth 2 (two) times daily for anxiety.   FLUoxetine (PROZAC) 20 MG capsule Take 1 capsule (20 mg total) by mouth every morning.   metoCLOPramide (REGLAN) 10 MG tablet Take 1 tablet (10 mg total) by mouth every 8 (eight) hours as needed for nausea.   PARoxetine (PAXIL) 30 MG tablet Take 30 mg by mouth daily.   thyroid (NP THYROID) 15 MG tablet TAKE 1 TABLET BY MOUTH FIRST THING IN THE MORNING ON AN EMPTY STOMACH (Patient taking differently: Take 15 mg by mouth daily.)   valACYclovir (VALTREX) 1000 MG  tablet Take 1 tablet by mouth 3 (three) times daily for 7 days (Patient taking differently: Take 1,000 mg by mouth as needed (Out breaks).)   [DISCONTINUED] predniSONE (DELTASONE) 20 MG tablet Take 2 tablets (40 mg total) by mouth daily with breakfast for 2 days, THEN 1 tablet (20 mg total) daily with breakfast for 2 days, THEN 0.5 tablets (10 mg total) daily with breakfast for 2 days.   [DISCONTINUED] promethazine (PHENERGAN) 25 MG tablet Take 1 tablet (25 mg total) by mouth every 8 (eight) hours as needed for nausea or vomiting.     Allergies:   Iodinated contrast media, Cephalosporins, Cyclosporine, Celecoxib, Gabapentin, Levothyroxine sodium, Liraglutide -weight management, Pregabalin, and Wellbutrin [bupropion]   Social History   Socioeconomic History   Marital status: Single    Spouse name: Not on file   Number of children: Not on file   Years of education: Not on file   Highest education level: Not on file  Occupational History   Not on file  Tobacco Use   Smoking status: Former    Types: Cigarettes    Quit date: 10/10/2018    Years since quitting: 4.3    Passive exposure: Past   Smokeless tobacco: Never   Tobacco comments:    Quit smoking 10/2018  Vaping Use   Vaping Use: Every day  Start date: 10/09/2018  Substance and Sexual Activity   Alcohol use: Not Currently   Drug use: Not Currently   Sexual activity: Not on file  Other Topics Concern   Not on file  Social History Narrative   Not on file   Social Determinants of Health   Financial Resource Strain: Not on file  Food Insecurity: Not on file  Transportation Needs: Not on file  Physical Activity: Not on file  Stress: Not on file  Social Connections: Not on file     Family History: The patient's family history includes Anemia in her sister; BRCA 1/2 in her mother and sister; Breast cancer in her maternal aunt; Diabetes in her brother and father; Hashimoto's thyroiditis in her sister; Valvular heart disease in  her mother. ROS:   Please see the history of present illness.    All 14 point review of systems negative except as described per history of present illness  EKGs/Labs/Other Studies Reviewed:      Recent Labs: 01/29/2023: ALT 20; BUN 16; Creatinine, Ser 0.78; Hemoglobin 13.1; Platelets 270.0; Potassium 4.2; Sodium 138; TSH 2.09  Recent Lipid Panel    Component Value Date/Time   CHOL 199 01/29/2023 1410   TRIG 126.0 01/29/2023 1410   HDL 53.50 01/29/2023 1410   CHOLHDL 4 01/29/2023 1410   VLDL 25.2 01/29/2023 1410   LDLCALC 120 (H) 01/29/2023 1410    Physical Exam:    VS:  BP 110/80 (BP Location: Left Arm, Patient Position: Sitting)   Pulse 75   Ht 5\' 8"  (1.727 m)   Wt 191 lb (86.6 kg)   SpO2 96%   BMI 29.04 kg/m     Wt Readings from Last 3 Encounters:  02/12/23 191 lb (86.6 kg)  01/29/23 193 lb (87.5 kg)  10/31/22 186 lb 1.9 oz (84.4 kg)     GEN:  Well nourished, well developed in no acute distress HEENT: Normal NECK: No JVD; No carotid bruits LYMPHATICS: No lymphadenopathy CARDIAC: RRR, no murmurs, no rubs, no gallops RESPIRATORY:  Clear to auscultation without rales, wheezing or rhonchi  ABDOMEN: Soft, non-tender, non-distended MUSCULOSKELETAL:  No edema; No deformity  SKIN: Warm and dry LOWER EXTREMITIES: no swelling NEUROLOGIC:  Alert and oriented x 3 PSYCHIATRIC:  Normal affect   ASSESSMENT:    1. Palpitations   2. Hereditary hemochromatosis (HCC)   3. Attention deficit disorder, unspecified hyperactivity presence    PLAN:    In order of problems listed above:  Palpitations.  Monitor previously had multiple triggers showing only sinus tachycardia.  I think her sinus tachycardia is related to simply deconditioning.  She said walking across the room make her heart speed up.  We had a long discussion about what to do with the situation.  I encouraged her to drink plenty of fluids also ask her to liberate some salt intake.  We did talk also about exercises  on the regular basis gradually build up his stamina.  I offer her also small dose of beta-blocker give her metoprolol titrate 12.5 twice daily and see if that helps however the issue is low blood pressure therefore drinking fluid and salt intake should help.  I did brought the issue of ivabradine however it is a very costly medication we may need to reach that if above mentioning measures will not improve. Hemochromatosis apparently stable ADD.  Noted She did have echocardiogram done in [place].  I did review results of the test which was normal  Medication Adjustments/Labs and Tests Ordered:  Current medicines are reviewed at length with the patient today.  Concerns regarding medicines are outlined above.  No orders of the defined types were placed in this encounter.  Medication changes: No orders of the defined types were placed in this encounter.   Signed, Georgeanna Lea, MD, [facility] 02/12/2023 11:17 AM    [facility]

## 2023-02-12 NOTE — Progress Notes (Signed)
Hematology and Oncology Follow Up Visit  Amber Sweeney [id number] 04/04/1981 42 y.o. 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 is here today for follow-up. She is doing well and has no complaint at this time.  Iron studies are stable at this time. She continues to donate with the [facility] every 2-3 months.  No fever, chills, n/v, cough, rash, dizziness, SOB, chest pain, palpitations, abdominal pain or changes in bowel or bladder habits.  No abnormal bruising or petechiae.  No swelling, tenderness, numbness or tingling in her extremities. No falls or syncope.  Appetite and hydration are good. Weight is stable at 191 lbs.   ECOG Performance Status: 1 - Symptomatic but completely ambulatory  Medications:  Allergies as of 02/12/2023       Reactions   Iodinated Contrast Media Other (See Comments)   Panic Attack   Cephalosporins Rash   Cyclosporine Hives   Celecoxib Other (See Comments)   Patient stated it gave her pancreatitis   Gabapentin Palpitations   Levothyroxine Sodium Palpitations   Liraglutide -weight Management Other (See Comments)   Injection site turned black for months   Pregabalin Diarrhea   Panic attack   Wellbutrin [bupropion] Other (See Comments)   "I didn't care about anything"        Medication List        Accurate as of Feb 12, 2023  1:35 PM. If you have any questions, ask your nurse or doctor.          STOP taking these medications    predniSONE 20 MG tablet Commonly known as: DELTASONE Stopped by: Gypsy Balsam, MD   promethazine 25 MG tablet Commonly known as: PHENERGAN Stopped by: Gypsy Balsam, MD       TAKE these medications    clonazePAM 0.5 MG tablet Commonly known as: KLONOPIN Take 1-2 tablets (0.5-1 mg total) by mouth 2 (two) times daily for anxiety.    FLUoxetine 20 MG capsule Commonly known as: PROzac Take 1 capsule (20 mg total) by mouth every morning.   metoCLOPramide 10 MG tablet Commonly known as: REGLAN Take 1 tablet (10 mg total) by mouth every 8 (eight) hours as needed for nausea.   NP Thyroid 15 MG tablet Generic drug: thyroid TAKE 1 TABLET BY MOUTH FIRST THING IN THE MORNING ON AN EMPTY STOMACH What changed:  how much to take how to take this when to take this   Paxil 30 MG tablet Generic drug: PARoxetine Take 30 mg by mouth daily.   valACYclovir 1000 MG tablet Commonly known as: Valtrex Take 1 tablet by mouth 3 (three) times daily for 7 days What changed:  when to take this reasons to take this   Vitamin D3 1.25 MG (50000 UT) Caps Take 1 capsule by mouth every 30 (thirty) days.        Allergies:  Allergies  Allergen Reactions   Iodinated Contrast Media Other (See Comments)    Panic Attack   Cephalosporins Rash   Cyclosporine Hives   Celecoxib Other (See Comments)    Patient stated it gave her pancreatitis   Gabapentin Palpitations   Levothyroxine Sodium Palpitations   Liraglutide -Weight Management Other (See Comments)    Injection site turned black for months   Pregabalin Diarrhea    Panic attack  Wellbutrin [Bupropion] Other (See Comments)    "I didn't care about anything"    Past Medical History, Surgical history, Social history, and Family History were reviewed and updated.  Review of Systems: All other 10 point review of systems is negative.   Physical Exam:  vitals were not taken for this visit.   Wt Readings from Last 3 Encounters:  02/12/23 191 lb (86.6 kg)  01/29/23 193 lb (87.5 kg)  10/31/22 186 lb 1.9 oz (84.4 kg)    Ocular: Sclerae unicteric, pupils equal, round and reactive to light Ear-nose-throat: Oropharynx clear, dentition fair Lymphatic: No cervical or supraclavicular adenopathy Lungs no rales or rhonchi, good excursion bilaterally Heart regular rate and  rhythm, no murmur appreciated Abd soft, nontender, positive bowel sounds MSK no focal spinal tenderness, no joint edema Neuro: non-focal, well-oriented, appropriate affect Breasts:   Lab Results  Component Value Date   WBC 8.1 01/29/2023   HGB 13.1 01/29/2023   HCT 38.3 01/29/2023   MCV 97.1 01/29/2023   PLT 270.0 01/29/2023   Lab Results  Component Value Date   FERRITIN 50 01/29/2023   IRON 107 01/29/2023   TIBC 295 01/29/2023   UIBC 92 (L) 10/31/2022   IRONPCTSAT 36 01/29/2023   Lab Results  Component Value Date   RBC 3.94 01/29/2023   No results found for: "KPAFRELGTCHN", "LAMBDASER", "KAPLAMBRATIO" No results found for: "IGGSERUM", "IGA", "IGMSERUM" No results found for: "TOTALPROTELP", "ALBUMINELP", "A1GS", "A2GS", "BETS", "BETA2SER", "GAMS", "MSPIKE", "SPEI"   Chemistry      Component Value Date/Time   NA 138 01/29/2023 1410   K 4.2 01/29/2023 1410   CL 101 01/29/2023 1410   CO2 29 01/29/2023 1410   BUN 16 01/29/2023 1410   CREATININE 0.78 01/29/2023 1410   CREATININE 0.87 10/31/2022 1242   CREATININE 0.75 06/24/2022 1522      Component Value Date/Time   CALCIUM 9.3 01/29/2023 1410   ALKPHOS 57 01/29/2023 1410   AST 16 01/29/2023 1410   AST 16 10/31/2022 1242   ALT 20 01/29/2023 1410   ALT 15 10/31/2022 1242   BILITOT 0.4 01/29/2023 1410   BILITOT 0.5 10/31/2022 1242       Impression and Plan: [name] is a very pleasant 42 yo caucasian female with history of hemochromatosis, homozygous for the C282Y mutation.  Iron studies remain stable. She will continue to donate with the [facility] every 2-3 months.  Follow-up in 4 months.   Eileen Stanford, NP 5/6/20241:35 PM

## 2023-02-12 NOTE — Patient Instructions (Addendum)
Medication Instructions:   START: Metoprolol Tartrate 12.5mg  1 tablet twice daily   Lab Work: None Ordered If you have labs (blood work) drawn today and your tests are completely normal, you will receive your results only by: MyChart Message (if you have MyChart) OR A paper copy in the mail If you have any lab test that is abnormal or we need to change your treatment, we will call you to review the results.   Testing/Procedures: None Ordered   Follow-Up: At [facility], you and your health needs are our priority.  As part of our continuing mission to provide you with exceptional heart care, we have created designated Provider Care Teams.  These Care Teams include your primary Cardiologist (physician) and Advanced Practice Providers (APPs -  Physician Assistants and Nurse Practitioners) who all work together to provide you with the care you need, when you need it.  We recommend signing up for the patient portal called "MyChart".  Sign up information is provided on this After Visit Summary.  MyChart is used to connect with patients for Virtual Visits (Telemedicine).  Patients are able to view lab/test results, encounter notes, upcoming appointments, etc.  Non-urgent messages can be sent to your provider as well.   To learn more about what you can do with MyChart, go to [email or web address].    Your next appointment:   3 month(s)  The format for your next appointment:   In Person  Provider:   Gypsy Balsam, MD    Other Instructions  Per Dr. Bing Matter: "Drink plenty of fluids, liberate salt intake and start exercising on the regular basis"

## 2023-02-13 ENCOUNTER — Encounter: Payer: Self-pay | Admitting: Family

## 2023-02-21 ENCOUNTER — Encounter: Payer: Self-pay | Admitting: Cardiology

## 2023-02-21 NOTE — Telephone Encounter (Signed)
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 Pt verbalized understanding and had no additional questions.

## 2023-02-28 ENCOUNTER — Encounter: Payer: Self-pay | Admitting: Family Medicine

## 2023-02-28 NOTE — Telephone Encounter (Signed)
I switched to "to" to [facility] in the referral. Anything else we need to do?

## 2023-03-01 ENCOUNTER — Encounter: Payer: Self-pay | Admitting: Family Medicine

## 2023-03-19 ENCOUNTER — Encounter (HOSPITAL_BASED_OUTPATIENT_CLINIC_OR_DEPARTMENT_OTHER): Payer: Self-pay | Admitting: Student

## 2023-03-19 ENCOUNTER — Ambulatory Visit (HOSPITAL_BASED_OUTPATIENT_CLINIC_OR_DEPARTMENT_OTHER): Payer: 59

## 2023-03-19 ENCOUNTER — Ambulatory Visit (INDEPENDENT_AMBULATORY_CARE_PROVIDER_SITE_OTHER): Payer: 59 | Admitting: Student

## 2023-03-19 DIAGNOSIS — M25571 Pain in right ankle and joints of right foot: Secondary | ICD-10-CM

## 2023-03-19 DIAGNOSIS — S9031XA Contusion of right foot, initial encounter: Secondary | ICD-10-CM | POA: Diagnosis not present

## 2023-03-19 DIAGNOSIS — R600 Localized edema: Secondary | ICD-10-CM | POA: Diagnosis not present

## 2023-03-19 NOTE — Progress Notes (Signed)
Chief Complaint: Right foot pain     History of Present Illness:    Amber Sweeney is a 42 y.o. female presents today for evaluation of pain in the right foot.  She states that 2 days ago she was running through the yard and kicked her foot into a metal stake that was planted in the ground to attach her dog's leash to.  Her pain levels are moderate and is mostly located in the top of the foot extending toward the lateral ankle and down toward the toes.  She does note pain with weightbearing and walking.  She has been wearing a normal shoe.  She is using ibuprofen for pain control and has been icing.  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   Hashimoto's thyroiditis    Hemochromatosis    Past Surgical History:  Procedure Laterality Date   CHOLECYSTECTOMY     Ovaries and tubes removed     WISDOM TOOTH EXTRACTION     Social History   Socioeconomic History   Marital status: Single    Spouse name: Not on file   Number of children: Not on file   Years of education: Not on file   Highest education level: Not on file  Occupational History   Not on file  Tobacco Use   Smoking status: Former    Types: Cigarettes    Quit date: 10/10/2018    Years since quitting: 4.4    Passive exposure: Past   Smokeless tobacco: Never   Tobacco comments:    Quit smoking 10/2018  Vaping Use   Vaping Use: Every day   Start date: 10/09/2018  Substance and Sexual Activity   Alcohol use: Not Currently   Drug use: Not Currently   Sexual activity: Not on file  Other Topics Concern   Not on file  Social History Narrative   Not on file   Social Determinants of Health   Financial Resource Strain: Not on file  Food Insecurity: Not on file  Transportation Needs: Not on file  Physical  Activity: Not on file  Stress: Not on file  Social Connections: Not on file   Family History  Problem Relation Age of Onset   Valvular heart disease Mother        Hx valve replacement   BRCA 1/2 Mother    Diabetes Father    Hashimoto's thyroiditis Sister    BRCA 1/2 Sister    Anemia Sister    Diabetes Brother    Breast cancer Maternal Aunt    Allergies  Allergen Reactions   Iodinated Contrast Media Other (See Comments)    Panic Attack   Cephalosporins Rash   Cyclosporine Hives   Celecoxib Other (See Comments)    Patient stated it gave her pancreatitis   Gabapentin Palpitations   Levothyroxine Sodium Palpitations   Liraglutide -Weight Management Other (See Comments)    Injection site turned black for months   Pregabalin Diarrhea    Panic attack     Wellbutrin [Bupropion] Other (See Comments)    "I didn't care about anything"   Current Outpatient Medications  Medication Sig Dispense Refill  Cholecalciferol (VITAMIN D3) 1.25 MG (50000 UT) CAPS Take 1 capsule by mouth every 30 (thirty) days. 3 capsule 1   clonazePAM (KLONOPIN) 0.5 MG tablet Take 1-2 tablets (0.5-1 mg total) by mouth 2 (two) times daily for anxiety. 120 tablet 0   FLUoxetine (PROZAC) 20 MG capsule Take 1 capsule (20 mg total) by mouth every morning. 90 capsule 1   metoCLOPramide (REGLAN) 10 MG tablet Take 1 tablet (10 mg total) by mouth every 8 (eight) hours as needed for nausea. 30 tablet 0   PARoxetine (PAXIL) 30 MG tablet Take 30 mg by mouth daily.     thyroid (NP THYROID) 15 MG tablet TAKE 1 TABLET BY MOUTH FIRST THING IN THE MORNING ON AN EMPTY STOMACH (Patient taking differently: Take 15 mg by mouth daily.) 90 tablet 1   valACYclovir (VALTREX) 1000 MG tablet Take 1 tablet by mouth 3 (three) times daily for 7 days (Patient taking differently: Take 1,000 mg by mouth as needed (Out breaks).) 21 tablet 2   No current facility-administered medications for this visit.   No results found.  Review of  Systems:   A ROS was performed including pertinent positives and negatives as documented in the HPI.  Physical Exam :   Constitutional: NAD and appears stated age Neurological: Alert and oriented Psych: Appropriate affect and cooperative There were no vitals taken for this visit.   Comprehensive Musculoskeletal Exam:    Ecchymosis and soft tissue edema noted over the dorsal right midfoot.  Tenderness to palpation throughout this area up toward the anterior ankle.  No tenderness over the medial or lateral malleolus.  Passive ROM of the ankle to 20 degrees dorsiflexion 40 degrees plantarflexion, with active ROM to 15 degrees dorsiflexion and 30 degrees plantarflexion.  Increased discomfort noted with toe extension and ankle dorsiflexion.  DP and PT pulses 2+.  Neurosensory exam intact.  Imaging:   Xray (Right foot 3 views, right ankle 3 views): Negative for fracture or dislocation.  Significant soft tissue edema surrounding the midfoot.  I personally reviewed and interpreted the radiographs.   Assessment:   42 y.o. female and bruising over the dorsum of the right foot after an injury 2 days ago.  X-rays appear negative for fracture so considering her mechanism of injury I suspect this to be a contusion.  Recommend continuing with ibuprofen for inflammation and pain control.  Continue RICE therapy when able.  Discussed that I believe this will begin to improve quickly, however these can take a few weeks to fully resolve.  Should symptoms worsen at any point or show little improvement after 3 to 4 weeks, I did discuss that we can follow-up at that point for further assessment.  All other questions welcomed and addressed.  Plan :    -Return to clinic as needed     I personally saw and evaluated the patient, and participated in the management and treatment plan.  Hazle Nordmann, PA-C Orthopedics  This document was dictated using Conservation officer, historic buildings. A reasonable attempt  at proof reading has been made to minimize errors.

## 2023-03-21 NOTE — Progress Notes (Signed)
Initial neurology clinic note  Amber Sweeney MRN: [id number] DOB: October 01, 1981  Referring provider: Clayborne Dana, NP  Primary care provider: Clayborne Dana, NP  Reason for consult:  headaches, intermittent painful paresthesia of skin  Subjective:  This is [name], a 42 y.o. female with a medical history of hereditary hemochromatosis (homozygous for C282Y mutation), Hashimoto's thyroiditis, depression, anxiety who presents to neurology clinic with headaches, intermittent painful paresthesia of skin. The patient is alone today.  Patient has had migraines since puberty. They went away for many years. Over the last year she has started having more headaches. She has 2-3 headaches per month that last 8-12 hours. She describes the pain as feeling like her head is being squeezed, mostly on the left, but also can include the right. She rates these as 6/10. There is associated photophobia and nausea, but no phonophobia. She wants to lay in a dark, quiet room when she gets this headache. The headache does not worsen with laying down. She has had blurry vision once, but otherwise denies vision changes. She denies aura.  She has a background headache 3 times per week. She describes this as pressure over her whole head. She denies neck pain. She rates it as a 2-3/10. There is no photophobia or nausea with this headache. She can work through this headache. There are periods of headache freedom.  She will take tylenol or ibuprofen 3 times per week. This helps with the background headache. It does not help with the more severe headaches. She does wait to see if headache will not go away. She will try to massage her pressure points or use peppermint oil. She will then take benadryl and reglan and this will help with the more severe headache. She will sleep and the headache is usually gone in 45 minutes.  She has tried Imitrex once in the past (years ago) and made her headaches significantly  worse. She did not take it ever again.  Patient also describes skin sensitivity in her arms, back, and head. She had it once on her legs. She feels like the nerves are on the surface of her skin. Any touch really hurts. It will come on out of the blue and then resolves on its own in a few days. This has happened about 3 times in the last 1-2 years. Patient was given Drucilla Schmidt for this in the past, but she was not able to tolerate it (felt loopy). At one point, someone thought she had shingles and was given valtrex. She is not sure if this did anything.  Of note, patient is on prozac 20 mg for hot flashes (ovaries out) and clonazepam 0.5-1 mg BID PRN for anxiety.  Caffeine: 1-2 glasses of tea per day Smoker: Vapes EtOH use: none Family history of migraines: Mother has migraines Family history of other neurologic disease: daughter with epilepsy  MEDICATIONS:  Outpatient Encounter Medications as of 03/28/2023  Medication Sig   FLUoxetine (PROZAC) 20 MG capsule Take 1 capsule (20 mg total) by mouth every morning.   metoCLOPramide (REGLAN) 10 MG tablet Take 1 tablet (10 mg total) by mouth every 8 (eight) hours as needed for nausea.   thyroid (NP THYROID) 15 MG tablet TAKE 1 TABLET BY MOUTH FIRST THING IN THE MORNING ON AN EMPTY STOMACH (Patient taking differently: Take 15 mg by mouth daily.)   valACYclovir (VALTREX) 1000 MG tablet Take 1 tablet by mouth 3 (three) times daily for 7 days (Patient taking differently:  Take 1,000 mg by mouth as needed (Out breaks).)   [DISCONTINUED] PARoxetine (PAXIL) 30 MG tablet Take 30 mg by mouth daily.   Cholecalciferol (VITAMIN D3) 1.25 MG (50000 UT) CAPS Take 1 capsule by mouth every 30 (thirty) days. (Patient not taking: Reported on 03/28/2023)   clonazePAM (KLONOPIN) 0.5 MG tablet Take 1-2 tablets (0.5-1 mg total) by mouth 2 (two) times daily for anxiety.   No facility-administered encounter medications on file as of 03/28/2023.    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   Hashimoto's thyroiditis    Hemochromatosis     PAST SURGICAL HISTORY: Past Surgical History:  Procedure Laterality Date   CHOLECYSTECTOMY     Ovaries and tubes removed     WISDOM TOOTH EXTRACTION      ALLERGIES: Allergies  Allergen Reactions   Iodinated Contrast Media Other (See Comments)    Panic Attack   Cephalosporins Rash   Cyclosporine Hives   Celecoxib Other (See Comments)    Patient stated it gave her pancreatitis   Gabapentin Palpitations   Levothyroxine Sodium Palpitations   Liraglutide -Weight Management Other (See Comments)    Injection site turned black for months   Pregabalin Diarrhea    Panic attack     Wellbutrin [Bupropion] Other (See Comments)    "I didn't care about anything"    FAMILY HISTORY: Family History  Problem Relation Age of Onset   Valvular heart disease Mother        Hx valve replacement   BRCA 1/2 Mother    Diabetes Father    Hashimoto's thyroiditis Sister    BRCA 1/2 Sister    Anemia Sister    Diabetes Brother    Breast cancer Maternal Aunt     SOCIAL HISTORY: Social History   Tobacco Use   Smoking status: Former    Types: Cigarettes    Quit date: 10/10/2018    Years since quitting: 4.4    Passive exposure: Past   Smokeless tobacco: Never   Tobacco comments:    Quit smoking 10/2018  Vaping Use   Vaping Use: Every day   Start date: 10/09/2018  Substance Use Topics   Alcohol use: Not Currently   Drug use: Not Currently   Social History   Social History Narrative   Not on file    Objective:  Vital Signs:  BP (!) 102/59   Pulse 78   Ht 5\' 8"  (1.727 m)   Wt 197 lb 6.4 oz (89.5 kg)   SpO2 98%   BMI 30.01 kg/m   General: No acute distress.  Patient appears well-groomed.   Head:  Normocephalic/atraumatic Eyes:  fundi examined. Disc margins clear. No obvious papilledema. Neck: supple, no paraspinal tenderness, full range of motion Lungs:  Non-labored breathing on room air   Neurological Exam: Mental status: alert and oriented, speech fluent and not dysarthric, language intact.  Cranial nerves: CN I: not tested CN II: pupils equal, round and reactive to light, visual fields intact CN III, IV, VI:  full range of motion, no nystagmus, ?ptosis of left eyelid vs asymmetry CN V: facial sensation intact. CN VII: upper and lower face symmetric CN VIII: hearing intact CN IX, X: uvula midline CN XI: sternocleidomastoid and trapezius muscles intact CN XII: tongue midline  Bulk & Tone: normal, no fasciculations. Motor:  muscle strength 5/5 throughout Deep Tendon Reflexes:  2+ throughout.   Sensation:  Pinprick sensation intact. Finger  to nose testing:  Without dysmetria.   Gait:  Normal station and stride.  Romberg negative.   Labs and Imaging review: Internal labs: Lab Results  Component Value Date   HGBA1C 4.8 01/29/2023   Lab Results  Component Value Date   TSH 2.09 01/29/2023   Iron studies (01/29/23): wnl (ferritin 50)  CMP (01/29/23): unremarkable CBC (01/29/23): unremarkable  Lipid panel (01/29/23): Component     Latest Ref Rng 01/29/2023  Cholesterol     0 - 200 mg/dL 387   Triglycerides     0.0 - 149.0 mg/dL 564.3   HDL Cholesterol     >39.00 mg/dL 32.95   VLDL     0.0 - 40.0 mg/dL 18.8   LDL (calc)     0 - 99 mg/dL 416 (H)   Total CHOL/HDL Ratio 4   NonHDL 145.12      Assessment/Plan:  Amber Sweeney is a 42 y.o. female who presents for evaluation of headache and intermittent painful paresthesias of bilateral arms, back, and head. 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 Her neurological examination is essentially normal today. Patient's headaches sound consistent with migraine without aura, with 3 severe headaches per month and a less severe headache about 12 times per month. Her headaches may be  complicated by medication overuse, as she takes tylenol or ibuprofen about 3 times per week. She has never been on a preventative medication. She is on Prozac for hot flashes, so SSRI or TCA would be contraindicated. We discussed Topamax and Propranolol. Given her low BP, she would prefer to try Topamax. She has previously tried Imitrex for migraine rescue, but was not able to tolerate it (made headaches worse).   In terms of the intermittent painful paresthesias in her arms, back, and head, the etiology of these symptoms are unclear. There is no clear root or nerve distribution. Given that they occur infrequently, I'm not sure the cause. We agreed to monitor for now, but if symptoms recur, I may consider imaging (MRI brain).  PLAN: -Blood work: B1, B12 -For migraines: Migraine prevention:  Topamax 25 mg daily Migraine rescue:  Maxalt 10 mg PRN at headache onset, can repeat after 2 hours Limit use of pain relievers to no more than 2 days out of week to prevent risk of rebound or medication-overuse headache. Keep headache diary  -Monitor arm symptoms for now. May consider further work up if symptoms recur  -Return to clinic in 3 months  The impression above as well as the plan as outlined below were extensively discussed with the patient who voiced understanding. All questions were answered to their satisfaction.  When available, results of the above investigations and possible further recommendations will be communicated to the patient via telephone/MyChart. Patient to call office if not contacted after expected testing turnaround time.   Total time spent reviewing records, interview, history/exam, documentation, and coordination of care on day of encounter: 55 min   Thank you for allowing me to participate in patient's care.  If I can answer any additional questions, I would be pleased to do so.  Jacquelyne Balint, MD   CC: Clayborne Dana, NP [address] [place]  [address]  CC: Referring provider: Clayborne Dana, NP [address] [place],  [place] [address]

## 2023-03-23 ENCOUNTER — Encounter: Payer: Self-pay | Admitting: Family Medicine

## 2023-03-28 ENCOUNTER — Encounter: Payer: Self-pay | Admitting: Neurology

## 2023-03-28 ENCOUNTER — Other Ambulatory Visit (INDEPENDENT_AMBULATORY_CARE_PROVIDER_SITE_OTHER): Payer: 59

## 2023-03-28 ENCOUNTER — Ambulatory Visit (INDEPENDENT_AMBULATORY_CARE_PROVIDER_SITE_OTHER): Payer: 59 | Admitting: Neurology

## 2023-03-28 ENCOUNTER — Other Ambulatory Visit: Payer: Self-pay

## 2023-03-28 ENCOUNTER — Other Ambulatory Visit (HOSPITAL_COMMUNITY): Payer: Self-pay

## 2023-03-28 VITALS — BP 102/59 | HR 78 | Ht 68.0 in | Wt 197.4 lb

## 2023-03-28 DIAGNOSIS — R200 Anesthesia of skin: Secondary | ICD-10-CM | POA: Diagnosis not present

## 2023-03-28 DIAGNOSIS — R202 Paresthesia of skin: Secondary | ICD-10-CM

## 2023-03-28 DIAGNOSIS — G43009 Migraine without aura, not intractable, without status migrainosus: Secondary | ICD-10-CM

## 2023-03-28 DIAGNOSIS — R209 Unspecified disturbances of skin sensation: Secondary | ICD-10-CM

## 2023-03-28 LAB — VITAMIN B12: Vitamin B-12: 295 pg/mL (ref 211–911)

## 2023-03-28 MED ORDER — TOPIRAMATE 25 MG PO TABS
25.0000 mg | ORAL_TABLET | Freq: Every day | ORAL | 3 refills | Status: DC
Start: 2023-03-28 — End: 2023-04-26
  Filled 2023-03-28 – 2023-04-02 (×2): qty 90, 90d supply, fill #0

## 2023-03-28 MED ORDER — RIZATRIPTAN BENZOATE 10 MG PO TABS
10.0000 mg | ORAL_TABLET | ORAL | 5 refills | Status: AC | PRN
Start: 2023-03-28 — End: ?
  Filled 2023-03-28: qty 10, 30d supply, fill #0
  Filled 2023-04-02: qty 9, 30d supply, fill #0

## 2023-03-28 NOTE — Patient Instructions (Addendum)
I saw you today for migraines and the odd sensations in your arms, back, and head. I am not sure the cause of the intermittent sensations. I will check some vitamins today and make sure this is not contributing. Your headaches sound consistent with migraine. Below is the plan for that.  Start Topamax 25 mg daily.  Contact us with update and we can increase dose if needed. Take Maxalt 10 mg at earliest onset of headache.  May repeat dose once in 2 hours if needed.  Maximum 2 tablets in 24 hours. Limit use of pain relievers to no more than 2 days out of the week.  These medications include acetaminophen, NSAIDs (ibuprofen/Advil/Motrin, naproxen/Aleve, triptans (Imitrex/sumatriptan), Excedrin, and narcotics.  This will help reduce risk of rebound headaches. Be aware of common food triggers:  - Caffeine:  coffee, black tea, cola, Mt. Dew  - Chocolate  - Dairy:  aged cheeses (brie, blue, cheddar, gouda, [place], provolone, [place], Swiss, etc), chocolate milk, buttermilk, sour cream, limit eggs and yogurt  - Nuts, peanut butter  - Alcohol  - Cereals/grains:  FRESH breads (fresh bagels, sourdough, doughnuts), yeast productions  - Processed/canned/aged/cured meats (pre-packaged deli meats, hotdogs)  - MSG/glutamate:  soy sauce, flavor enhancer, pickled/preserved/marinated foods  - Sweeteners:  aspartame (Equal, Nutrasweet).  Sugar and Splenda are okay  - Vegetables:  legumes (lima beans, lentils, snow peas, fava beans, pinto peans, peas, garbanzo beans), sauerkraut, onions, olives, pickles  - Fruit:  avocados, bananas, citrus fruit (orange, lemon, grapefruit), mango  - Other:  Frozen meals, macaroni and cheese Routine exercise Stay adequately hydrated (aim for 64 oz water daily) Keep headache diary Maintain proper stress management Maintain proper sleep hygiene Do not skip meals Consider supplements that may help with headaches:  magnesium citrate 400mg  daily, riboflavin 400mg  daily, coenzyme Q10  100mg  three times daily.  The physicians and staff at [facility] are committed to providing excellent care. You may receive a survey requesting feedback about your experience at our office. We strive to receive "very good" responses to the survey questions. If you feel that your experience would prevent you from giving the office a "very good " response, please contact our office to try to remedy the situation. We may be reached at [phone]. Thank you for taking the time out of your busy day to complete the survey.  Jacquelyne Balint, MD [facility]

## 2023-04-01 LAB — VITAMIN B1: Vitamin B1 (Thiamine): 17 nmol/L (ref 8–30)

## 2023-04-02 ENCOUNTER — Encounter: Payer: Self-pay | Admitting: Family

## 2023-04-02 ENCOUNTER — Encounter: Payer: Self-pay | Admitting: Family Medicine

## 2023-04-02 ENCOUNTER — Other Ambulatory Visit (HOSPITAL_COMMUNITY): Payer: Self-pay

## 2023-04-02 ENCOUNTER — Other Ambulatory Visit (HOSPITAL_BASED_OUTPATIENT_CLINIC_OR_DEPARTMENT_OTHER): Payer: Self-pay

## 2023-04-02 ENCOUNTER — Other Ambulatory Visit: Payer: Self-pay

## 2023-04-02 ENCOUNTER — Encounter: Payer: Self-pay | Admitting: Neurology

## 2023-04-02 DIAGNOSIS — B029 Zoster without complications: Secondary | ICD-10-CM

## 2023-04-02 MED ORDER — CHOLECALCIFEROL 1.25 MG (50000 UT) PO CAPS
50000.0000 [IU] | ORAL_CAPSULE | ORAL | 1 refills | Status: DC
  Filled 2023-04-02: qty 3, 90d supply, fill #0
  Filled 2023-09-13: qty 3, 90d supply, fill #1

## 2023-04-02 MED ORDER — THYROID 15 MG PO TABS
15.0000 mg | ORAL_TABLET | Freq: Every morning | ORAL | 3 refills | Status: DC
  Filled 2023-04-02: qty 90, 90d supply, fill #0
  Filled 2023-09-13: qty 90, 90d supply, fill #1
  Filled 2023-10-01 – 2023-10-12 (×2): qty 90, 90d supply, fill #0

## 2023-04-02 MED ORDER — FLUOXETINE HCL 20 MG PO CAPS
20.0000 mg | ORAL_CAPSULE | Freq: Every morning | ORAL | 1 refills | Status: DC
  Filled 2023-04-02: qty 90, 90d supply, fill #0
  Filled 2023-09-13: qty 90, 90d supply, fill #1

## 2023-04-02 MED ORDER — VALACYCLOVIR HCL 1 G PO TABS
1000.0000 mg | ORAL_TABLET | Freq: Three times a day (TID) | ORAL | 2 refills | Status: DC
Start: 2023-04-02 — End: 2023-10-30
  Filled 2023-04-02 (×2): qty 21, 7d supply, fill #0

## 2023-04-02 MED ORDER — CLONAZEPAM 0.5 MG PO TABS
0.5000 mg | ORAL_TABLET | Freq: Two times a day (BID) | ORAL | 0 refills | Status: AC
  Filled 2023-04-02: qty 120, 30d supply, fill #0

## 2023-04-02 NOTE — Telephone Encounter (Signed)
RX sent

## 2023-04-18 ENCOUNTER — Other Ambulatory Visit: Payer: Self-pay | Admitting: Oncology

## 2023-04-18 DIAGNOSIS — Z006 Encounter for examination for normal comparison and control in clinical research program: Secondary | ICD-10-CM

## 2023-04-26 ENCOUNTER — Other Ambulatory Visit (HOSPITAL_COMMUNITY): Payer: Self-pay

## 2023-04-26 ENCOUNTER — Other Ambulatory Visit: Payer: Self-pay | Admitting: Neurology

## 2023-04-26 DIAGNOSIS — G43009 Migraine without aura, not intractable, without status migrainosus: Secondary | ICD-10-CM

## 2023-04-26 MED ORDER — PROPRANOLOL HCL 20 MG PO TABS
20.0000 mg | ORAL_TABLET | Freq: Two times a day (BID) | ORAL | 5 refills | Status: DC
Start: 2023-04-26 — End: 2024-07-01
  Filled 2023-04-26: qty 60, 30d supply, fill #0
  Filled 2023-07-19: qty 60, 30d supply, fill #1
  Filled 2023-09-13: qty 60, 30d supply, fill #2
  Filled 2023-10-01: qty 60, 30d supply, fill #0
  Filled 2023-12-19: qty 60, 30d supply, fill #1
  Filled 2024-02-15: qty 60, 30d supply, fill #0
  Filled 2024-02-15: qty 60, 30d supply, fill #2
  Filled 2024-04-16: qty 60, 30d supply, fill #1

## 2023-04-27 ENCOUNTER — Other Ambulatory Visit: Payer: Self-pay

## 2023-04-27 ENCOUNTER — Other Ambulatory Visit (HOSPITAL_COMMUNITY): Payer: Self-pay

## 2023-04-30 ENCOUNTER — Encounter: Payer: Self-pay | Admitting: Neurology

## 2023-05-15 ENCOUNTER — Ambulatory Visit: Payer: 59 | Attending: Cardiology | Admitting: Cardiology

## 2023-05-15 ENCOUNTER — Ambulatory Visit: Payer: 59 | Admitting: Psychiatry

## 2023-05-15 ENCOUNTER — Encounter: Payer: Self-pay | Admitting: Cardiology

## 2023-05-15 VITALS — BP 104/70 | HR 64 | Ht 68.0 in | Wt 203.0 lb

## 2023-05-15 DIAGNOSIS — R002 Palpitations: Secondary | ICD-10-CM | POA: Diagnosis not present

## 2023-05-15 DIAGNOSIS — F988 Other specified behavioral and emotional disorders with onset usually occurring in childhood and adolescence: Secondary | ICD-10-CM

## 2023-05-15 DIAGNOSIS — G43109 Migraine with aura, not intractable, without status migrainosus: Secondary | ICD-10-CM | POA: Diagnosis not present

## 2023-05-15 DIAGNOSIS — E063 Autoimmune thyroiditis: Secondary | ICD-10-CM | POA: Diagnosis not present

## 2023-05-15 NOTE — Patient Instructions (Signed)
Medication Instructions:  Your physician recommends that you continue on your current medications as directed. Please refer to the Current Medication list given to you today.  *If you need a refill on your cardiac medications before your next appointment, please call your pharmacy*   Lab Work: None If you have labs (blood work) drawn today and your tests are completely normal, you will receive your results only by: [place] (if you have MyChart) OR A paper copy in the mail If you have any lab test that is abnormal or we need to change your treatment, we will call you to review the results.   Testing/Procedures: None   Follow-Up: At [facility], you and your health needs are our priority.  As part of our continuing mission to provide you with exceptional heart care, we have created designated Provider Care Teams.  These Care Teams include your primary Cardiologist (physician) and Advanced Practice Providers (APPs -  Physician Assistants and Nurse Practitioners) who all work together to provide you with the care you need, when you need it.  We recommend signing up for the patient portal called "MyChart".  Sign up information is provided on this After Visit Summary.  MyChart is used to connect with patients for Virtual Visits (Telemedicine).  Patients are able to view lab/test results, encounter notes, upcoming appointments, etc.  Non-urgent messages can be sent to your provider as well.   To learn more about what you can do with MyChart, go to [email or web address].    Your next appointment:   1 year(s)  Provider:   Jenne Campus, MD    Other Instructions

## 2023-05-15 NOTE — Progress Notes (Signed)
Cardiology Office Note:    Date:  05/15/2023   ID:  [name], DOB 12-Jan-1981, MRN [id number]  PCP:  Clayborne Dana, NP  Cardiologist:  Gypsy Balsam, MD    Referring MD: Clayborne Dana, NP     History of Present Illness:    Amber Sweeney is a 42 y.o. female with past medical history significant for chemo derma ptosis, palpitations, Hashimoto thyroiditis she was referred to [place] for palpitations.  She was diagnosed with PVCs.  Additional problem close migraine anxiety.  She was put on propranolol by neurologist for her migraines that seems to be controlling her rhythm much better denies having palpitations doing quite well.  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   Cholecalciferol 1.25 MG (50000 UT) capsule Take 1 capsule (50,000 Units total) by mouth every 30 (thirty) days.   clonazePAM (KLONOPIN) 0.5 MG tablet Take 1-2 tablets (0.5-1 mg total) by mouth 2 (two) times daily for anxiety   FLUoxetine (PROZAC) 20 MG capsule Take 1 capsule (20 mg total) by mouth every morning.   metoCLOPramide (REGLAN) 10 MG tablet Take 1 tablet (10 mg total) by mouth every 8 (eight) hours as needed for nausea.   propranolol (INDERAL) 20 MG tablet Take 1 tablet (20 mg total) by mouth 2 (two) times daily.   rizatriptan (MAXALT) 10 MG tablet Take 1 tablet (10 mg total) by mouth as needed for migraine. May repeat in 2 hours if needed   thyroid (NP THYROID) 15 MG tablet Take 1 tablet (15 mg total) by mouth first thing in the morning on an empty stomach   valACYclovir (VALTREX) 1000 MG tablet Take 1 tablet by  mouth 3 (three) times daily for 7 days     Allergies:   Iodinated contrast media, Cephalosporins, Cyclosporine, Sumatriptan, Celecoxib, Gabapentin, Levothyroxine sodium, Liraglutide -weight management, Pregabalin, Topiramate, and Wellbutrin [bupropion]   Social History   Socioeconomic History   Marital status: Single    Spouse name: Not on file   Number of children: Not on file   Years of education: Not on file   Highest education level: Not on file  Occupational History   Not on file  Tobacco Use   Smoking status: Former    Current packs/day: 0.00    Types: Cigarettes    Quit date: 10/10/2018    Years since quitting: 4.5    Passive exposure: Past   Smokeless tobacco: Never   Tobacco comments:    Quit smoking 10/2018  Vaping Use   Vaping status: Every Day   Start date: 10/09/2018  Substance and Sexual Activity   Alcohol use: Not Currently   Drug use: Not Currently   Sexual activity: Not on file  Other Topics Concern   Not on file  Social History Narrative   Not on file   Social Determinants of Health   Financial Resource Strain: Not on file  Food Insecurity: No Food Insecurity (12/17/2020)   Received from [facility], [facility]   Hunger Vital Sign    Worried  About Running Out of Food in the Last Year: Never true    Ran Out of Food in the Last Year: Never true  Transportation Needs: Not on file  Physical Activity: Not on file  Stress: Not on file  Social Connections: Unknown (02/08/2022)   Received from [facility], [facility]   Social Network    Social Network: Not on file     Family History: The patient's family history includes Anemia in her sister; BRCA 1/2 in her mother and sister; Breast cancer in her maternal aunt; Diabetes in her brother and father; Hashimoto's thyroiditis in her sister; Valvular heart disease in her mother. ROS:   Please see the history of present illness.    All 14 point review of systems negative except as described per history  of present illness  EKGs/Labs/Other Studies Reviewed:         Recent Labs: 01/29/2023: ALT 20; BUN 16; Creatinine, Ser 0.78; Hemoglobin 13.1; Platelets 270.0; Potassium 4.2; Sodium 138; TSH 2.09  Recent Lipid Panel    Component Value Date/Time   CHOL 199 01/29/2023 1410   TRIG 126.0 01/29/2023 1410   HDL 53.50 01/29/2023 1410   CHOLHDL 4 01/29/2023 1410   VLDL 25.2 01/29/2023 1410   LDLCALC 120 (H) 01/29/2023 1410    Physical Exam:    VS:  BP 104/70 (BP Location: Left Arm, Patient Position: Sitting)   Pulse 64   Ht 5\' 8"  (1.727 m)   Wt 203 lb (92.1 kg)   SpO2 97%   BMI 30.87 kg/m     Wt Readings from Last 3 Encounters:  05/15/23 203 lb (92.1 kg)  03/28/23 197 lb 6.4 oz (89.5 kg)  02/12/23 191 lb (86.6 kg)     GEN:  Well nourished, well developed in no acute distress HEENT: Normal NECK: No JVD; No carotid bruits LYMPHATICS: No lymphadenopathy CARDIAC: RRR, no murmurs, no rubs, no gallops RESPIRATORY:  Clear to auscultation without rales, wheezing or rhonchi  ABDOMEN: Soft, non-tender, non-distended MUSCULOSKELETAL:  No edema; No deformity  SKIN: Warm and dry LOWER EXTREMITIES: no swelling NEUROLOGIC:  Alert and oriented x 3 PSYCHIATRIC:  Normal affect   ASSESSMENT:    1. Palpitations   2. Attention deficit disorder, unspecified hyperactivity presence   3. Migraine with aura and without status migrainosus, not intractable   4. Hashimoto's thyroiditis    PLAN:    In order of problems listed above:  Palpitations denies having any now with propranolol given by neurologist.  Will continue.  She was concerned about her slow heart rate she showed me on her watch which show heart rate at night dropping to 55 I reassured her that it is not worrisome we will continue monitoring and therapy should there where she feels. ADD follow-up by antimedicine team. Migraine much better now with propranolol. Dyslipidemia only mildly elevated continue monitoring   Medication  Adjustments/Labs and Tests Ordered: Current medicines are reviewed at length with the patient today.  Concerns regarding medicines are outlined above.  No orders of the defined types were placed in this encounter.  Medication changes: No orders of the defined types were placed in this encounter.   Signed, Georgeanna Lea, MD, [facility] 05/15/2023 11:35 AM    [facility]

## 2023-05-27 ENCOUNTER — Encounter: Payer: Self-pay | Admitting: Family Medicine

## 2023-06-04 ENCOUNTER — Other Ambulatory Visit: Payer: Self-pay | Admitting: Family Medicine

## 2023-06-04 DIAGNOSIS — Z1231 Encounter for screening mammogram for malignant neoplasm of breast: Secondary | ICD-10-CM

## 2023-06-05 IMAGING — MG MM DIGITAL SCREENING BILAT W/ TOMO AND CAD
6 of 12 series · 6 of 36 positions shown · non-contrast
Comparison: Previous exam(s).

CLINICAL DATA: Screening.

EXAM:
DIGITAL SCREENING BILATERAL MAMMOGRAM WITH TOMOSYNTHESIS AND CAD
TECHNIQUE: Bilateral screening digital craniocaudal and mediolateral oblique
mammograms were obtained. Bilateral screening digital breast
tomosynthesis was performed. The images were evaluated with
computer-aided detection.

[R MLO synth-2D (1 of 2)]
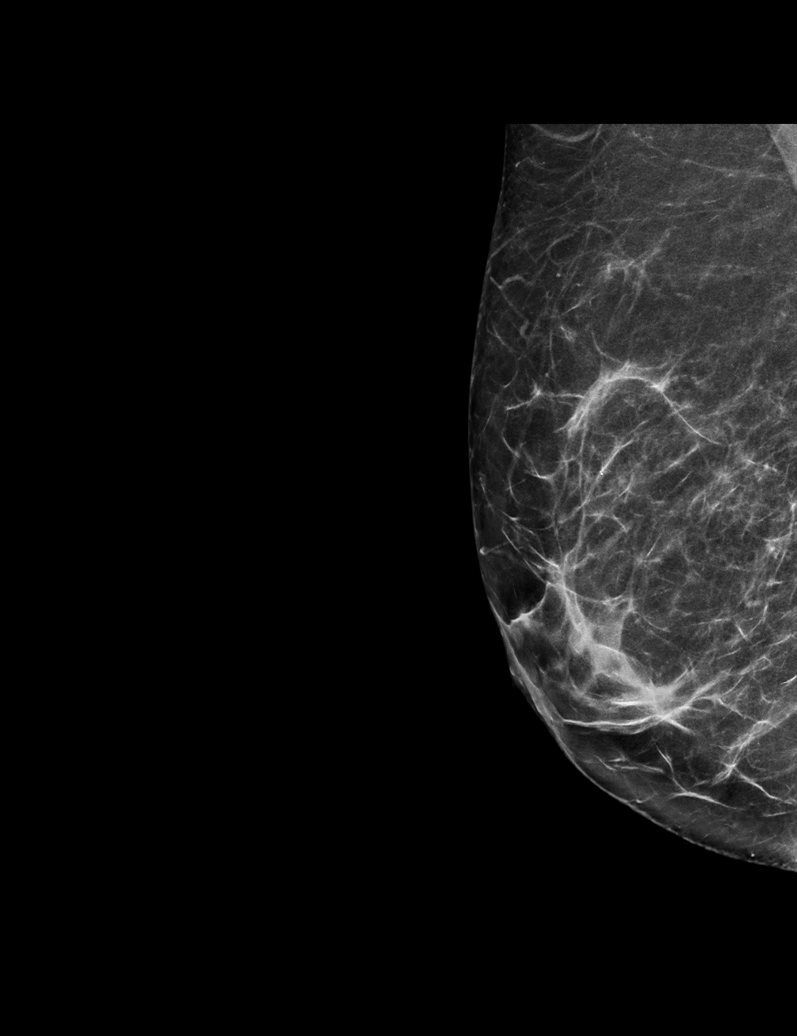

[R MLO synth-2D (2 of 2)]
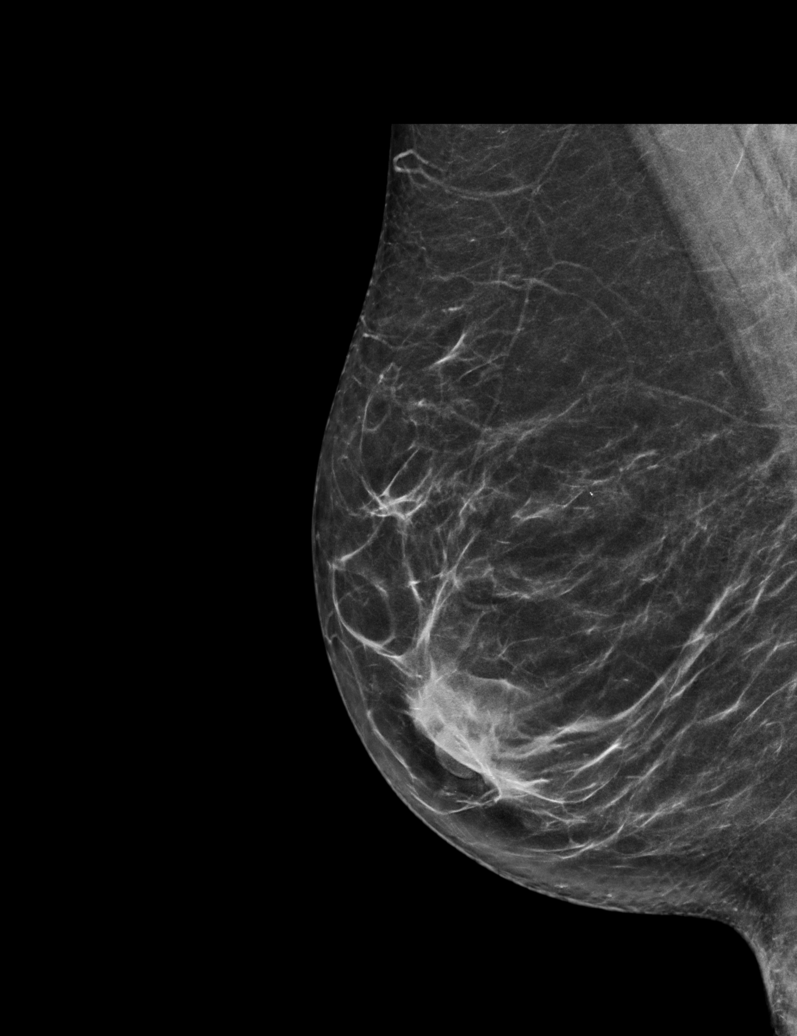

[R CC synth-2D (1 of 2)]
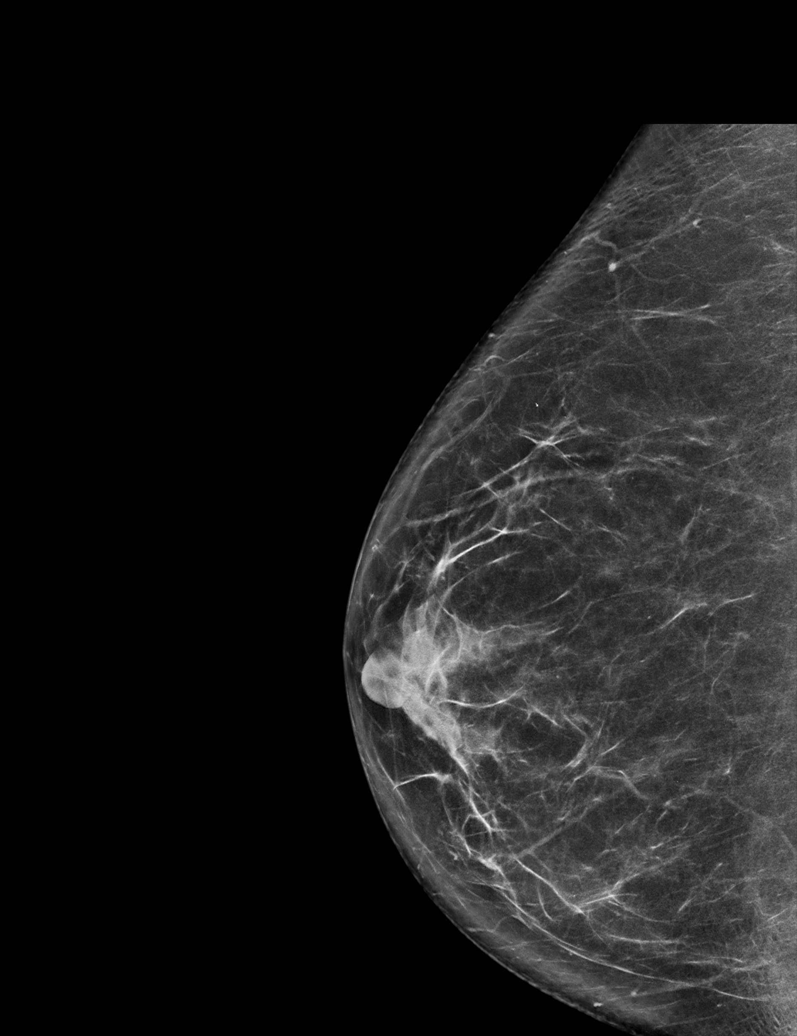

[R CC synth-2D (2 of 2)]
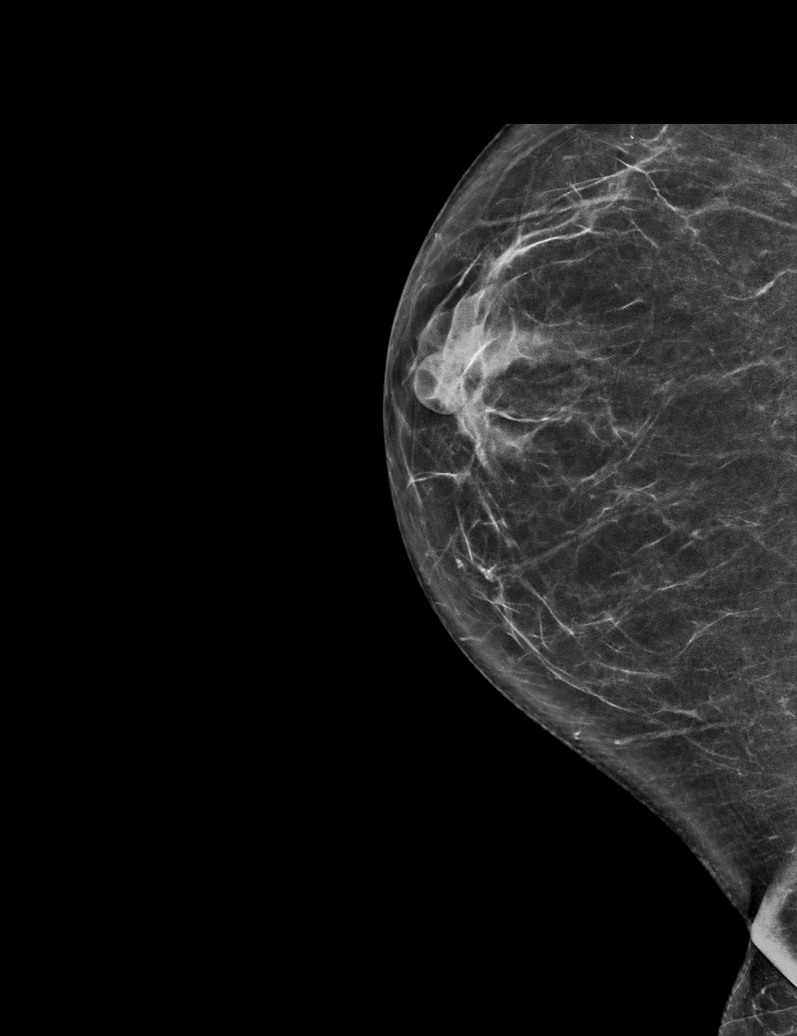

[L MLO synth-2D]
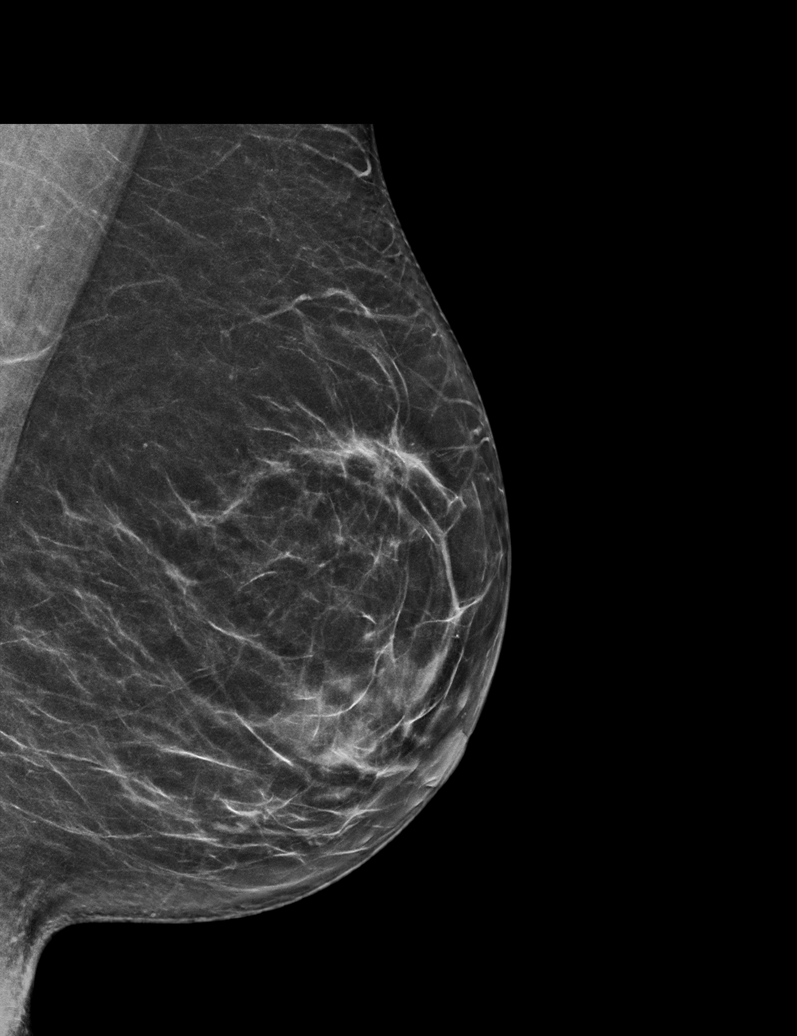

[L CC synth-2D]
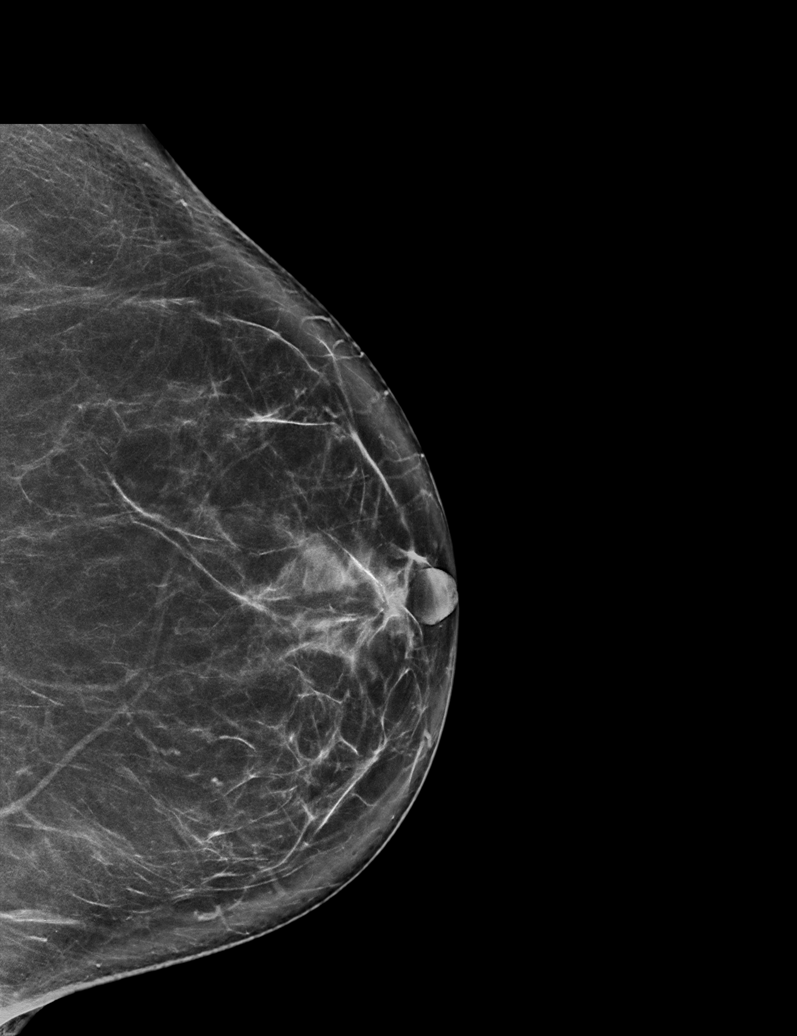

[6 of 36 positions shown; findings below may reference images not displayed]

ACR Breast Density Category b: There are scattered areas of
fibroglandular density.
FINDINGS: There are no findings suspicious for malignancy.
IMPRESSION: No mammographic evidence of malignancy. A result letter of this
screening mammogram will be mailed directly to the patient.

RECOMMENDATION:
Screening mammogram in one year. (Code:51-O-LD2)

BI-RADS CATEGORY  1: Negative.

## 2023-06-07 ENCOUNTER — Encounter: Payer: Self-pay | Admitting: Family Medicine

## 2023-06-12 IMAGING — CR DG CHEST 2V
2 series · 2 of 2 positions shown · non-contrast
Comparison: None.

CLINICAL DATA: Pain across her chest with shortness of breath for
few months

EXAM:
CHEST - 2 VIEW

[chest pa]
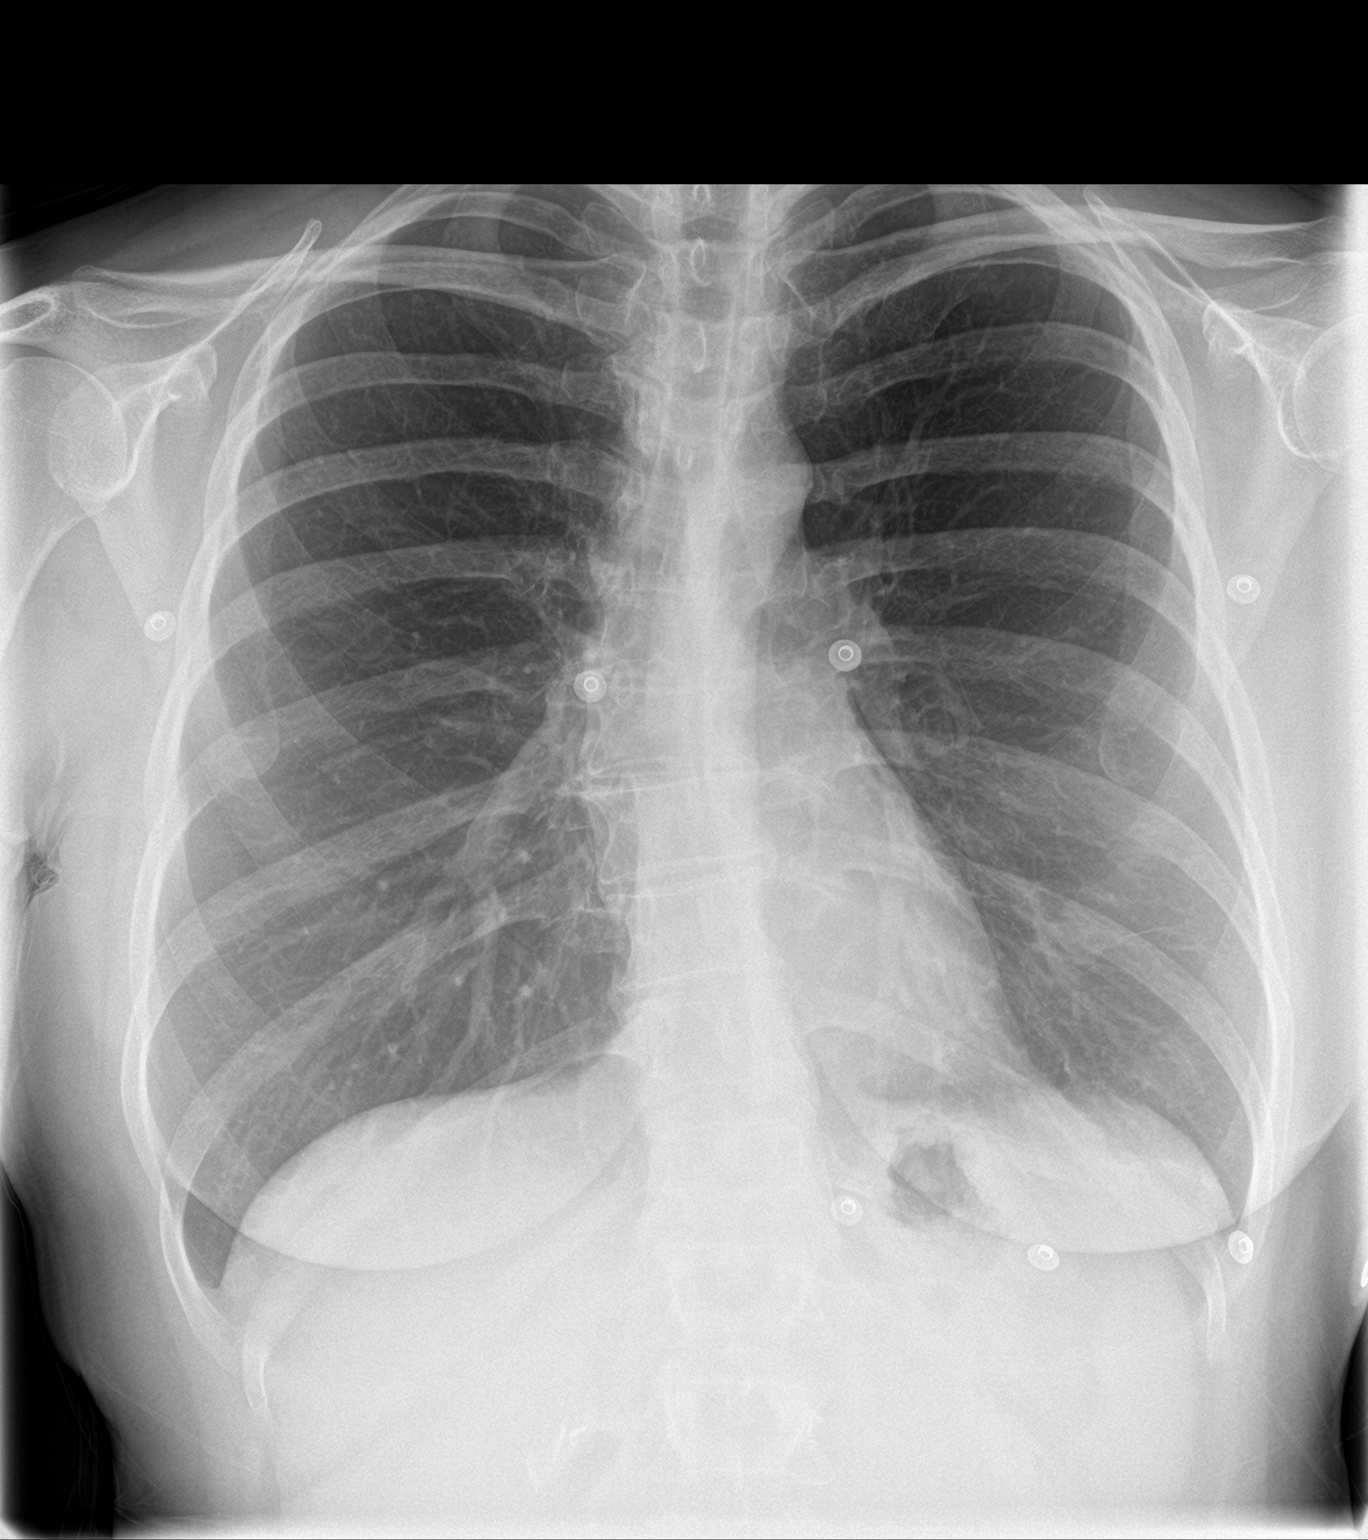

[chest lat]
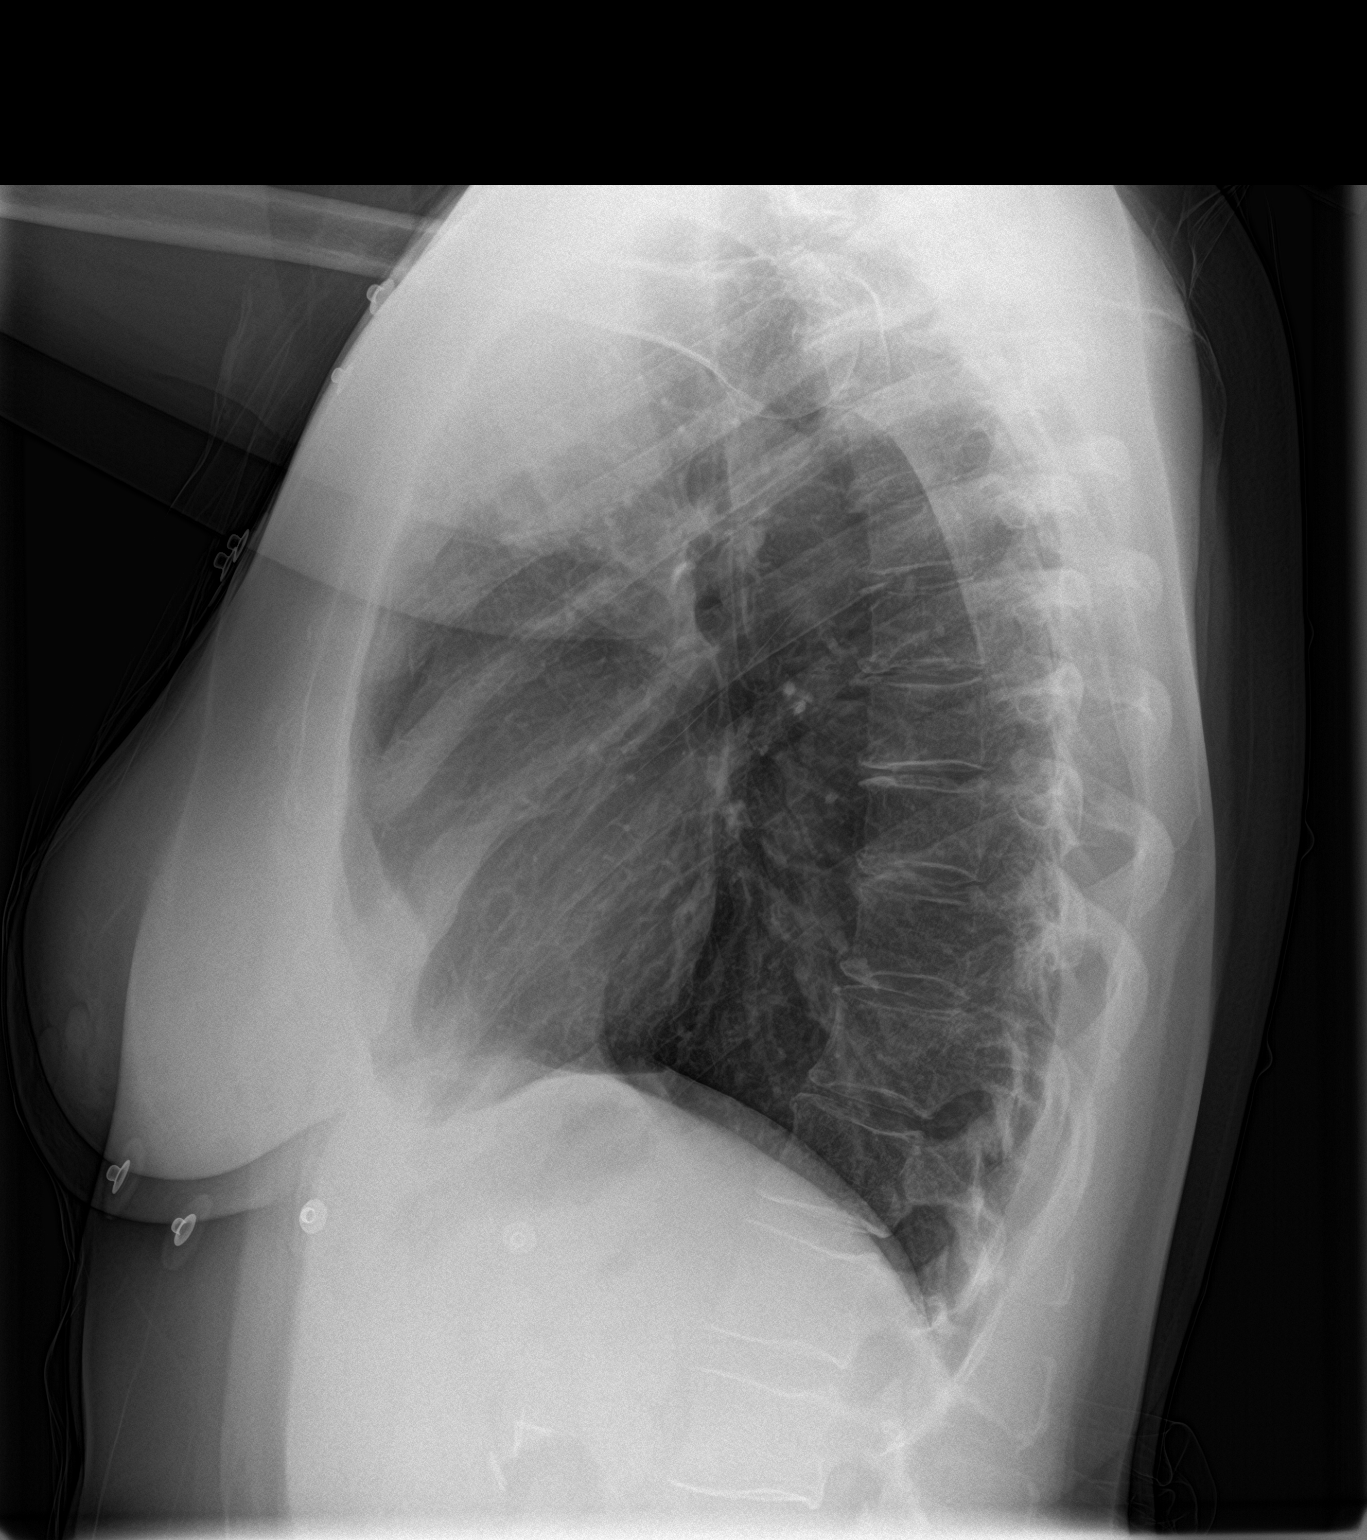

[2 of 2 positions shown; findings below may reference images not displayed]

FINDINGS: The cardiomediastinal silhouette is normal.

There is no focal consolidation or pulmonary edema. There is no
pleural effusion or pneumothorax.

There is no acute osseous abnormality.
IMPRESSION: No radiographic evidence of acute cardiopulmonary process.

## 2023-06-15 ENCOUNTER — Other Ambulatory Visit: Payer: 59

## 2023-06-15 ENCOUNTER — Ambulatory Visit: Payer: 59 | Admitting: Family

## 2023-06-18 NOTE — Progress Notes (Deleted)
NEUROLOGY FOLLOW UP OFFICE NOTE  Amber Sweeney [id number]  Subjective:  Amber Sweeney is a 42 y.o. 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  To briefly review: Patient has had migraines since puberty. They went away for many years. Over the last year she has started having more headaches. She has 2-3 headaches per month that last 8-12 hours. She describes the pain as feeling like her head is being squeezed, mostly on the left, but also can include the right. She rates these as 6/10. There is associated photophobia and nausea, but no phonophobia. She wants to lay in a dark, quiet room when she gets this headache. The headache does not worsen with laying down. She has had blurry vision once, but otherwise denies vision changes. She denies aura.   She has a background headache 3 times per week. She describes this as pressure over her whole head. She denies neck pain. She rates it as a 2-3/10. There is no photophobia or nausea with this headache. She can work through this headache. There are periods of headache freedom.   She will take tylenol or ibuprofen 3 times per week. This helps with the background headache. It does not help with the more severe headaches. She does wait to see if headache will not go away. She will try to massage her pressure points or use peppermint oil. She will then take benadryl and reglan and this will help with the more severe headache. She will sleep and the headache is usually gone in 45 minutes.   She has tried Imitrex once in the past (years ago) and made her headaches significantly worse. She did not take it ever again.   Patient also describes skin sensitivity in her arms, back, and head. She had it once on her legs. She feels like the nerves are on the surface of her skin.  Any touch really hurts. It will come on out of the blue and then resolves on its own in a few days. This has happened about 3 times in the last 1-2 years. Patient was given Drucilla Schmidt for this in the past, but she was not able to tolerate it (felt loopy). At one point, someone thought she had shingles and was given valtrex. She is not sure if this did anything.   Of note, patient is on prozac 20 mg for hot flashes (ovaries out) and clonazepam 0.5-1 mg BID PRN for anxiety.   Caffeine: 1-2 glasses of tea per day Smoker: Vapes EtOH use: none Family history of migraines: Mother has migraines Family history of other neurologic disease: daughter with epilepsy  Most recent Assessment and Plan (03/28/23): Amber Sweeney is a 42 y.o. female who presents for evaluation of headache and intermittent painful paresthesias of bilateral arms, back, and head. 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 Her neurological examination is essentially normal today. Patient's headaches sound consistent with migraine without aura, with 3 severe headaches per month and a less severe headache about 12 times per month. Her headaches may be complicated by medication overuse, as she takes tylenol or ibuprofen about 3 times per week. She has never been on a preventative medication. She is on Prozac for hot flashes, so SSRI or TCA would be contraindicated. We discussed Topamax and Propranolol. Given her low  BP, she would prefer to try Topamax. She has previously tried Imitrex for migraine rescue, but was not able to tolerate it (made headaches worse).    In terms of the intermittent painful paresthesias in her arms, back, and head, the etiology of these symptoms are unclear. There is no clear root or nerve distribution. Given that they occur infrequently, I'm not sure the cause. We agreed to monitor for now, but if symptoms recur, I may  consider imaging (MRI brain).   PLAN: -Blood work: B1, B12 -For migraines: Migraine prevention:  Topamax 25 mg daily Migraine rescue:  Maxalt 10 mg PRN at headache onset, can repeat after 2 hours Limit use of pain relievers to no more than 2 days out of week to prevent risk of rebound or medication-overuse headache. Keep headache diary   -Monitor arm symptoms for now. May consider further work up if symptoms recur  Since their last visit: Patient's B12 was borderline low, so B12 1000 mcg daily was recommended. ***  Patient had palpitations on Topamax, so the decision was made to stop it. I started propranolol 20 mg BID on 04/26/23 given her history of palpitations.  Headaches?***  Near syncope, patient thinks she has POTS. She has talked to cardiology about it. ***  MEDICATIONS:  Outpatient Encounter Medications as of 06/27/2023  Medication Sig   b complex vitamins capsule Take 1 capsule by mouth daily.   Cholecalciferol 1.25 MG (50000 UT) capsule Take 1 capsule (50,000 Units total) by mouth every 30 (thirty) days.   clonazePAM (KLONOPIN) 0.5 MG tablet Take 1-2 tablets (0.5-1 mg total) by mouth 2 (two) times daily for anxiety   FLUoxetine (PROZAC) 20 MG capsule Take 1 capsule (20 mg total) by mouth every morning.   metoCLOPramide (REGLAN) 10 MG tablet Take 1 tablet (10 mg total) by mouth every 8 (eight) hours as needed for nausea.   propranolol (INDERAL) 20 MG tablet Take 1 tablet (20 mg total) by mouth 2 (two) times daily.   rizatriptan (MAXALT) 10 MG tablet Take 1 tablet (10 mg total) by mouth as needed for migraine. May repeat in 2 hours if needed   thyroid (NP THYROID) 15 MG tablet Take 1 tablet (15 mg total) by mouth first thing in the morning on an empty stomach   valACYclovir (VALTREX) 1000 MG tablet Take 1 tablet by mouth 3 (three) times daily for 7 days   No facility-administered encounter medications on file as of 06/27/2023.    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   Hashimoto's thyroiditis    Hemochromatosis     PAST SURGICAL HISTORY: Past Surgical History:  Procedure Laterality Date   CHOLECYSTECTOMY     Ovaries and tubes removed     WISDOM TOOTH EXTRACTION      ALLERGIES: Allergies  Allergen Reactions   Iodinated Contrast Media Other (See Comments)    Panic Attack   Cephalosporins Rash   Cyclosporine Hives   Sumatriptan Other (See Comments)   Celecoxib Other (See Comments)    Patient stated it gave her pancreatitis   Gabapentin Palpitations   Levothyroxine Sodium Palpitations   Liraglutide -Weight Management Other (See Comments)    Injection site turned black for months   Pregabalin Diarrhea    Panic attack     Topiramate Palpitations   Wellbutrin [Bupropion] Other (See Comments)    "I didn't care about anything"    FAMILY HISTORY: Family History  Problem Relation Age of Onset   Valvular heart disease Mother        Hx valve replacement   BRCA 1/2 Mother    Diabetes Father    Hashimoto's thyroiditis Sister    BRCA 1/2 Sister    Anemia Sister    Diabetes Brother    Breast cancer Maternal Aunt     SOCIAL HISTORY: Social History   Tobacco Use   Smoking status: Former    Current packs/day: 0.00    Types: Cigarettes    Quit date: 10/10/2018    Years since quitting: 4.6    Passive exposure: Past   Smokeless tobacco: Never   Tobacco comments:    Quit smoking 10/2018  Vaping Use   Vaping status: Every Day   Start date: 10/09/2018  Substance Use Topics   Alcohol use: Not Currently   Drug use: Not Currently   Social History   Social History Narrative   Not on file      Objective:  Vital Signs:  There were no vitals taken for this visit.  ***  Labs and Imaging review: New results: 03/28/23: B1 wnl B12: 295  Previously reviewed results: Lab Results  Component Value Date    HGBA1C 4.8 01/29/2023      Recent Labs[] Expand by Default       Lab  Results  Component Value Date    TSH 2.09 01/29/2023      Iron studies (01/29/23): wnl (ferritin 50)   CMP (01/29/23): unremarkable CBC (01/29/23): unremarkable   Lipid panel (01/29/23): Component     Latest Ref Rng 01/29/2023  Cholesterol     0 - 200 mg/dL 604   Triglycerides     0.0 - 149.0 mg/dL 540.9   HDL Cholesterol     >39.00 mg/dL 81.19   VLDL     0.0 - 40.0 mg/dL 14.7   LDL (calc)     0 - 99 mg/dL 829 (H)   Total CHOL/HDL Ratio 4   NonHDL 145.12     Assessment/Plan:  This is [name], a 42 y.o. female with migraines, likely with aura. She tried topamax, but was unable to tolerate (caused palpitations). She is doing well with propranolol 20 mg BID with *** headaches per month currently.***   Plan: *** -For migraines: Migraine prevention:  Continue propranolol 20 mg BID Migraine rescue:  Maxalt 10 mg PRN at headache onset, can repeat after 2 hours  Limit use of pain relievers to no more than 2 days out of week to prevent risk of rebound or medication-overuse headache. Keep headache diary  Return to clinic in ***  Total time spent reviewing records, interview, history/exam, documentation, and coordination of care on day of encounter:  *** min  Jacquelyne Balint, MD

## 2023-06-27 ENCOUNTER — Encounter: Payer: Self-pay | Admitting: Family Medicine

## 2023-06-27 ENCOUNTER — Encounter: Payer: Self-pay | Admitting: Neurology

## 2023-06-27 ENCOUNTER — Ambulatory Visit: Payer: 59

## 2023-06-27 ENCOUNTER — Ambulatory Visit: Payer: 59 | Admitting: Neurology

## 2023-06-27 ENCOUNTER — Ambulatory Visit: Payer: 59 | Admitting: Family Medicine

## 2023-06-27 DIAGNOSIS — Z79899 Other long term (current) drug therapy: Secondary | ICD-10-CM

## 2023-07-19 ENCOUNTER — Other Ambulatory Visit (HOSPITAL_BASED_OUTPATIENT_CLINIC_OR_DEPARTMENT_OTHER): Payer: Self-pay

## 2023-08-21 ENCOUNTER — Other Ambulatory Visit: Payer: Self-pay | Attending: Oncology

## 2023-09-13 ENCOUNTER — Encounter: Payer: Self-pay | Admitting: Pharmacist

## 2023-09-13 ENCOUNTER — Other Ambulatory Visit (HOSPITAL_COMMUNITY): Payer: Self-pay

## 2023-09-13 ENCOUNTER — Other Ambulatory Visit: Payer: Self-pay

## 2023-09-13 ENCOUNTER — Encounter: Payer: Self-pay | Admitting: Family

## 2023-09-18 ENCOUNTER — Other Ambulatory Visit: Payer: Self-pay

## 2023-10-01 ENCOUNTER — Other Ambulatory Visit (HOSPITAL_BASED_OUTPATIENT_CLINIC_OR_DEPARTMENT_OTHER): Payer: Self-pay

## 2023-10-01 ENCOUNTER — Other Ambulatory Visit (HOSPITAL_COMMUNITY): Payer: Self-pay

## 2023-10-11 ENCOUNTER — Other Ambulatory Visit (HOSPITAL_BASED_OUTPATIENT_CLINIC_OR_DEPARTMENT_OTHER): Payer: Self-pay

## 2023-10-12 ENCOUNTER — Other Ambulatory Visit (HOSPITAL_BASED_OUTPATIENT_CLINIC_OR_DEPARTMENT_OTHER): Payer: Self-pay

## 2023-10-12 ENCOUNTER — Other Ambulatory Visit (HOSPITAL_COMMUNITY): Payer: Self-pay

## 2023-10-12 ENCOUNTER — Encounter: Payer: Self-pay | Admitting: Family

## 2023-10-22 ENCOUNTER — Encounter: Payer: Self-pay | Admitting: Family

## 2023-10-22 ENCOUNTER — Other Ambulatory Visit (HOSPITAL_BASED_OUTPATIENT_CLINIC_OR_DEPARTMENT_OTHER): Payer: Self-pay

## 2023-10-24 ENCOUNTER — Inpatient Hospital Stay (HOSPITAL_BASED_OUTPATIENT_CLINIC_OR_DEPARTMENT_OTHER): Admission: RE | Admit: 2023-10-24 | Payer: 59 | Source: Ambulatory Visit | Admitting: Radiology

## 2023-10-24 ENCOUNTER — Encounter (HOSPITAL_BASED_OUTPATIENT_CLINIC_OR_DEPARTMENT_OTHER): Payer: Self-pay

## 2023-10-24 DIAGNOSIS — Z1231 Encounter for screening mammogram for malignant neoplasm of breast: Secondary | ICD-10-CM

## 2023-10-30 ENCOUNTER — Encounter: Payer: Self-pay | Admitting: Family Medicine

## 2023-10-30 ENCOUNTER — Encounter: Payer: Self-pay | Admitting: Family

## 2023-10-30 ENCOUNTER — Other Ambulatory Visit (HOSPITAL_COMMUNITY): Payer: Self-pay

## 2023-10-30 ENCOUNTER — Other Ambulatory Visit: Payer: Self-pay

## 2023-10-30 ENCOUNTER — Ambulatory Visit: Payer: 59 | Admitting: Family Medicine

## 2023-10-30 VITALS — BP 103/56 | HR 63 | Ht 68.0 in | Wt 232.0 lb

## 2023-10-30 DIAGNOSIS — E559 Vitamin D deficiency, unspecified: Secondary | ICD-10-CM | POA: Insufficient documentation

## 2023-10-30 DIAGNOSIS — Z131 Encounter for screening for diabetes mellitus: Secondary | ICD-10-CM

## 2023-10-30 DIAGNOSIS — R635 Abnormal weight gain: Secondary | ICD-10-CM

## 2023-10-30 DIAGNOSIS — E063 Autoimmune thyroiditis: Secondary | ICD-10-CM | POA: Diagnosis not present

## 2023-10-30 DIAGNOSIS — R002 Palpitations: Secondary | ICD-10-CM | POA: Diagnosis not present

## 2023-10-30 DIAGNOSIS — F419 Anxiety disorder, unspecified: Secondary | ICD-10-CM | POA: Diagnosis not present

## 2023-10-30 DIAGNOSIS — B029 Zoster without complications: Secondary | ICD-10-CM | POA: Diagnosis not present

## 2023-10-30 DIAGNOSIS — R5383 Other fatigue: Secondary | ICD-10-CM | POA: Diagnosis not present

## 2023-10-30 DIAGNOSIS — Z6835 Body mass index (BMI) 35.0-35.9, adult: Secondary | ICD-10-CM

## 2023-10-30 LAB — TSH: TSH: 3.22 u[IU]/mL (ref 0.35–5.50)

## 2023-10-30 LAB — COMPREHENSIVE METABOLIC PANEL
ALT: 21 U/L (ref 0–35)
AST: 18 U/L (ref 0–37)
Albumin: 4.1 g/dL (ref 3.5–5.2)
Alkaline Phosphatase: 52 U/L (ref 39–117)
BUN: 10 mg/dL (ref 6–23)
CO2: 29 meq/L (ref 19–32)
Calcium: 8.6 mg/dL (ref 8.4–10.5)
Chloride: 104 meq/L (ref 96–112)
Creatinine, Ser: 0.7 mg/dL (ref 0.40–1.20)
GFR: 106.14 mL/min (ref >60.00)
Glucose, Bld: 88 mg/dL (ref 70–99)
Potassium: 4.5 meq/L (ref 3.5–5.1)
Sodium: 140 meq/L (ref 135–145)
Total Bilirubin: 0.3 mg/dL (ref 0.2–1.2)
Total Protein: 6.3 g/dL (ref 6.0–8.3)

## 2023-10-30 LAB — CBC WITH DIFFERENTIAL/PLATELET
Basophils Absolute: 0 10*3/uL (ref 0.0–0.1)
Basophils Relative: 0.5 % (ref 0.0–3.0)
Eosinophils Absolute: 0.2 10*3/uL (ref 0.0–0.7)
Eosinophils Relative: 3.2 % (ref 0.0–5.0)
HCT: 39.4 % (ref 36.0–46.0)
Hemoglobin: 13.2 g/dL (ref 12.0–15.0)
Lymphocytes Relative: 41 % (ref 12.0–46.0)
Lymphs Abs: 2.4 10*3/uL (ref 0.7–4.0)
MCHC: 33.4 g/dL (ref 30.0–36.0)
MCV: 97.6 fL (ref 78.0–100.0)
Monocytes Absolute: 0.4 10*3/uL (ref 0.1–1.0)
Monocytes Relative: 7.2 % (ref 3.0–12.0)
Neutro Abs: 2.8 10*3/uL (ref 1.4–7.7)
Neutrophils Relative %: 48.1 % (ref 43.0–77.0)
Platelets: 299 10*3/uL (ref 150.0–400.0)
RBC: 4.04 Mil/uL (ref 3.87–5.11)
RDW: 12.9 % (ref 11.5–15.5)
WBC: 5.8 10*3/uL (ref 4.0–10.5)

## 2023-10-30 LAB — CORTISOL: Cortisol, Plasma: 6.8 ug/dL

## 2023-10-30 LAB — HEMOGLOBIN A1C: Hgb A1c MFr Bld: 5.6 % (ref 4.6–6.5)

## 2023-10-30 LAB — IBC + FERRITIN
Ferritin: 70.9 ng/mL (ref 10.0–291.0)
Iron: 84 ug/dL (ref 42–145)
Saturation Ratios: 30.2 % (ref 20.0–50.0)
TIBC: 278.6 ug/dL (ref 250.0–450.0)
Transferrin: 199 mg/dL — ABNORMAL LOW (ref 212.0–360.0)

## 2023-10-30 LAB — B12 AND FOLATE PANEL
Folate: 16.8 ng/mL (ref >5.9)
Vitamin B-12: 303 pg/mL (ref 211–911)

## 2023-10-30 LAB — T4, FREE: Free T4: 0.84 ng/dL (ref 0.60–1.60)

## 2023-10-30 LAB — VITAMIN D 25 HYDROXY (VIT D DEFICIENCY, FRACTURES): VITD: 22.13 ng/mL — ABNORMAL LOW (ref 30.00–100.00)

## 2023-10-30 MED ORDER — CHOLECALCIFEROL 1.25 MG (50000 UT) PO CAPS
50000.0000 [IU] | ORAL_CAPSULE | ORAL | 1 refills | Status: DC
  Filled 2023-10-30: qty 3, 90d supply, fill #0

## 2023-10-30 MED ORDER — VALACYCLOVIR HCL 1 G PO TABS
1000.0000 mg | ORAL_TABLET | Freq: Three times a day (TID) | ORAL | 2 refills | Status: AC
  Filled 2023-10-30 – 2024-02-15 (×2): qty 21, 7d supply, fill #0

## 2023-10-30 NOTE — Assessment & Plan Note (Signed)
Patient has history of hemochromatosis and donates blood regularly.  -Check iron and ferritin levels. Forward results to hematology if abnormal.

## 2023-10-30 NOTE — Progress Notes (Signed)
Acute Office Visit  Subjective:     Patient ID: Amber Sweeney, female    DOB: January 14, 1981, 43 y.o.   MRN: [id number]  Chief Complaint  Patient presents with   Medical Management of Chronic Issues    Weight gain    HPI Patient is in today for weight gain, fatigue.   Discussed the use of AI scribe software for clinical note transcription with the patient, who gave verbal consent to proceed.  History of Present Illness   The patient, with a history of Hashimoto's thyroiditis, presents with concerns of significant weight gain over the past three to four months despite maintaining a high protein, low carbohydrate diet and regular physical activity. They report constant fatigue and cold intolerance, as well as recent episodes of hot sweats. The patient also mentions palpitations, which have decreased since starting propranolol, taken once daily due to concerns of a low pulse rate.  The patient has been off thyroid medication for approximately three to four weeks, following advice from an endocrinologist who deemed it unnecessary due to borderline thyroid levels. They have since been taking a methylated multivitamin in an attempt to manage their thyroid levels.  The patient also reports significant stressors, including a pregnant family member living with them and recent issues with their children's schooling. They mention poor sleep since the removal of their ovaries several years ago.  The patient is also on Prozac and occasional Klonopin, with Reglan as needed. They have been taking high-dose Vitamin D, which they stopped about one and a half to two months ago. They also have a history of hemochromatosis, for which they give blood regularly.  The patient denies any new medications, swelling in thyroid, or changes in their current medications.               Wt Readings from Last 3 Encounters:  10/30/23 232 lb (105.2 kg)  05/15/23 203 lb (92.1 kg)  03/28/23 197 lb 6.4 oz  (89.5 kg)    ROS All review of systems negative except what is listed in the HPI      Objective:    BP (!) 103/56   Pulse 63   Ht 5\' 8"  (1.727 m)   Wt 232 lb (105.2 kg)   SpO2 100%   BMI 35.28 kg/m    Physical Exam Vitals reviewed.  Constitutional:      General: She is not in acute distress.    Appearance: Normal appearance. She is obese. She is not ill-appearing.  Neck:     Thyroid: No thyroid mass, thyromegaly or thyroid tenderness.  Cardiovascular:     Rate and Rhythm: Normal rate and regular rhythm.     Heart sounds: Normal heart sounds.  Pulmonary:     Effort: Pulmonary effort is normal.     Breath sounds: Normal breath sounds.  Skin:    General: Skin is warm and dry.  Neurological:     Mental Status: She is alert and oriented to person, place, and time.  Psychiatric:        Mood and Affect: Mood normal.        Behavior: Behavior normal.        Thought Content: Thought content normal.        Judgment: Judgment normal.       No results found for any visits on 10/30/23.      Assessment & Plan:   Problem List Items Addressed This Visit       Active Problems  Hereditary hemochromatosis (HCC)   Patient has history of hemochromatosis and donates blood regularly.  -Check iron and ferritin levels. Forward results to hematology if abnormal.      Relevant Orders   CBC with Differential/Platelet   IBC + Ferritin   Hashimoto's thyroiditis   Patient stopped taking thyroid medication (previously on NP thyroid) -Check TSH and T4 levels to assess current thyroid function.      Relevant Orders   TSH   T4, free   Anxiety   Patient is currently on Prozac and occasional Klonopin. Reports high levels of stress due to personal circumstances. No SI/HI. -Consider adjusting medications if labs do not reveal a clear cause for symptoms.      Palpitations   Patient reports palpitations have decreased with propranolol use, but only taking once daily due to  concerns about low heart rate. -Continue propranolol once daily as it seems to be effective and well-tolerated.      Vitamin D deficiency   Patient has been taking over-the-counter vitamin D supplements because she ran out of high dose about a month ago. -Check vitamin D levels to assess current status.          Relevant Medications   Cholecalciferol 1.25 MG (50000 UT) capsule   Other Relevant Orders   VITAMIN D 25 Hydroxy (Vit-D Deficiency, Fractures)   Other Visit Diagnoses       Weight gain    -  Primary Significant weight gain over the past few months despite dietary changes and increased physical activity. Patient reports fatigue and cold intolerance. -Order labs today   Relevant Orders   CBC with Differential/Platelet   Comprehensive metabolic panel   TSH     Herpes zoster without complication       Relevant Medications   valACYclovir (VALTREX) 1000 MG tablet     BMI 35.0-35.9,adult       Relevant Orders   HgB A1c     Fatigue, unspecified type     Labs today   Relevant Orders   CBC with Differential/Platelet   TSH   B12 and Folate Panel   IBC + Ferritin   T4, free   Cortisol     Screening for diabetes mellitus     Family history. She is concerned about her weight and would like rechecked.    Relevant Orders   HgB A1c       Meds ordered this encounter  Medications   Cholecalciferol 1.25 MG (50000 UT) capsule    Sig: Take 1 capsule (50,000 Units total) by mouth every 30 (thirty) days.    Dispense:  3 capsule    Refill:  1   valACYclovir (VALTREX) 1000 MG tablet    Sig: Take 1 tablet by mouth 3 (three) times daily for 7 days    Dispense:  21 tablet    Refill:  2    Return for - pending lab results.  Clayborne Dana, NP

## 2023-10-30 NOTE — Assessment & Plan Note (Signed)
Patient has been taking over-the-counter vitamin D supplements because she ran out of high dose about a month ago. -Check vitamin D levels to assess current status.

## 2023-10-30 NOTE — Assessment & Plan Note (Signed)
Patient reports palpitations have decreased with propranolol use, but only taking once daily due to concerns about low heart rate. -Continue propranolol once daily as it seems to be effective and well-tolerated.

## 2023-10-30 NOTE — Assessment & Plan Note (Signed)
Patient stopped taking thyroid medication (previously on NP thyroid) -Check TSH and T4 levels to assess current thyroid function.

## 2023-10-30 NOTE — Assessment & Plan Note (Signed)
Patient is currently on Prozac and occasional Klonopin. Reports high levels of stress due to personal circumstances. No SI/HI. -Consider adjusting medications if labs do not reveal a clear cause for symptoms.

## 2023-10-31 ENCOUNTER — Encounter: Payer: Self-pay | Admitting: Family Medicine

## 2023-10-31 DIAGNOSIS — E66812 Obesity, class 2: Secondary | ICD-10-CM

## 2023-10-31 DIAGNOSIS — R635 Abnormal weight gain: Secondary | ICD-10-CM

## 2023-11-01 ENCOUNTER — Ambulatory Visit (HOSPITAL_BASED_OUTPATIENT_CLINIC_OR_DEPARTMENT_OTHER)
Admission: RE | Admit: 2023-11-01 | Discharge: 2023-11-01 | Disposition: A | Discharge status: Home / Self Care | Payer: 59 | Source: Ambulatory Visit

## 2023-11-01 ENCOUNTER — Encounter (HOSPITAL_BASED_OUTPATIENT_CLINIC_OR_DEPARTMENT_OTHER): Payer: Self-pay | Admitting: Radiology

## 2023-11-01 DIAGNOSIS — Z1231 Encounter for screening mammogram for malignant neoplasm of breast: Secondary | ICD-10-CM | POA: Diagnosis not present

## 2023-11-02 ENCOUNTER — Other Ambulatory Visit: Payer: Self-pay

## 2023-11-05 NOTE — Telephone Encounter (Signed)
Referral pended

## 2023-12-19 ENCOUNTER — Other Ambulatory Visit (HOSPITAL_BASED_OUTPATIENT_CLINIC_OR_DEPARTMENT_OTHER): Payer: Self-pay

## 2023-12-19 ENCOUNTER — Encounter (INDEPENDENT_AMBULATORY_CARE_PROVIDER_SITE_OTHER): Payer: Self-pay

## 2023-12-19 ENCOUNTER — Encounter: Payer: Self-pay | Admitting: Family

## 2023-12-19 MED ORDER — CHOLECALCIFEROL 1.25 MG (50000 UT) PO CAPS
50000.0000 [IU] | ORAL_CAPSULE | ORAL | 1 refills | Status: DC
  Filled 2023-12-19 – 2024-02-15 (×2): qty 3, 90d supply, fill #0
  Filled 2024-02-15: qty 3, 90d supply, fill #1
  Filled 2024-04-16: qty 3, 90d supply, fill #0

## 2023-12-24 ENCOUNTER — Encounter: Payer: Self-pay | Admitting: Family Medicine

## 2023-12-27 ENCOUNTER — Encounter (INDEPENDENT_AMBULATORY_CARE_PROVIDER_SITE_OTHER): Payer: Self-pay

## 2024-01-29 ENCOUNTER — Telehealth: Payer: Self-pay | Admitting: Cardiology

## 2024-01-29 NOTE — Telephone Encounter (Signed)
 New Message:     Patient would like to switch from Dr Gordan Latina to Dr Veryl Gottron. Is this alright with you?

## 2024-02-05 ENCOUNTER — Encounter (HOSPITAL_BASED_OUTPATIENT_CLINIC_OR_DEPARTMENT_OTHER): Payer: Self-pay | Admitting: Cardiology

## 2024-02-05 ENCOUNTER — Ambulatory Visit (INDEPENDENT_AMBULATORY_CARE_PROVIDER_SITE_OTHER): Admitting: Cardiology

## 2024-02-05 VITALS — BP 105/78 | HR 66 | Ht 68.0 in | Wt 232.0 lb

## 2024-02-05 DIAGNOSIS — Z7189 Other specified counseling: Secondary | ICD-10-CM | POA: Diagnosis not present

## 2024-02-05 DIAGNOSIS — R002 Palpitations: Secondary | ICD-10-CM

## 2024-02-05 DIAGNOSIS — R000 Tachycardia, unspecified: Secondary | ICD-10-CM

## 2024-02-05 DIAGNOSIS — Z712 Person consulting for explanation of examination or test findings: Secondary | ICD-10-CM

## 2024-02-05 DIAGNOSIS — G9089 Other disorders of autonomic nervous system: Secondary | ICD-10-CM

## 2024-02-05 NOTE — Patient Instructions (Addendum)
 Medication Instructions:  Your physician recommends that you continue on your current medications as directed. Please refer to the Current Medication list given to you today.   *If you need a refill on your cardiac medications before your next appointment, please call your pharmacy*  Follow-Up: At [facility], you and your health needs are our priority.  As part of our continuing mission to provide you with exceptional heart care, our providers are all part of one team.  This team includes your primary Cardiologist (physician) and Advanced Practice Providers or APPs (Physician Assistants and Nurse Practitioners) who all work together to provide you with the care you need, when you need it.  Please follow up in 6 months with Dr. Veryl Gottron, Slater Duncan, NP or Neomi Banks, NP   We recommend signing up for the patient portal called "MyChart".  Sign up information is provided on this After Visit Summary.  MyChart is used to connect with patients for Virtual Visits (Telemedicine).  Patients are able to view lab/test results, encounter notes, upcoming appointments, etc.  Non-urgent messages can be sent to your provider as well.   To learn more about what you can do with MyChart, go to [email or web address].   Other Instructions  -avoid dehydration. Often it requires high volumes of fluids, often with salt/electrolytes included, to stay hydrated. People with orthostasis are very sensitive to fluid shifts and dehydration. Oral rehydration is preferred, and routine use of IV fluids is not recommended. -if tolerated, compression stocking can assist with fluid management and prevent pooling in the legs. -slow position changes are recommended -if there is a feeling of severe lightheadedness, like near to passing out, recommend lying on the floor on the back, with legs elevated up on a chair or up against the wall. -the best long term management is gradual exercise conditioning. I  recommend seated exercises such as bike to start, to avoid the risk of falling with lightheadedness. Exercise programs, either through supervised programs like cardiac rehab or through personal programs, should focus on gradually increasing exercise tolerance and conditioning.    -this is a link to specific exercise recommendations: http://peterson-powell.net/ -we discussed the typical spectrum of dysautonomia, including typical populations, that this sometimes spontaneously improves with age (though a small percentage have persistent symptoms), that this has uncomfortable symptoms but is not associated with long term mortality, and that the etiology/treatment of this is an area of active research   https://www.graham-perkins.biz/

## 2024-02-05 NOTE — Progress Notes (Signed)
 Cardiology Office Note:  .   Date:  02/05/2024  ID:  Amber Sweeney, DOB 1981/02/07, MRN [id number] PCP: Everlina Hock, NP  [facility] Providers Cardiologist:  None {  History of Present Illness: .   Amber Sweeney is a 43 y.o. female with palpitations seen for follow up. She was previously followed by Dr. Gordan Latina and established care with me on 02/05/24.  Today: Notes that just sitting still, her heart rate can go up to 130 bpm for several minutes. Notes that HR can be 107 just walking to the bathroom. Other times, HR can be 80s while working outside. Has been going on for about 2 years. No full syncope, but has had presyncope 2-3 times/week. No clear triggers/patterns. Resolves quickly with rest. Walks a mile a day on a walking pad.  Reviewed monitor in 2023, was having symptoms at the time. 77 symptomatic events, vast majority were sinus, a few were sinus with ectopy.  Feels like propranolol  is generally helpful.  ROS: Denies chest pain, shortness of breath at rest or with normal exertion. No PND, orthopnea, LE edema or unexpected weight gain. No syncope. ROS otherwise negative except as noted.   Studies Reviewed: Aaron Aas    EKG:  EKG Interpretation Date/Time:  Tuesday February 05 2024 11:37:48 EDT Ventricular Rate:  66 PR Interval:  170 QRS Duration:  90 QT Interval:  410 QTC Calculation: 429 R Axis:   5  Text Interpretation: Normal sinus rhythm Normal ECG Confirmed by Sheryle Donning [id number]) on 02/05/2024 12:15:09 PM    Physical Exam:   VS:  BP 105/78   Pulse 66   Ht 5\' 8"  (1.727 m)   Wt 232 lb (105.2 kg)   SpO2 95%   BMI 35.28 kg/m    Wt Readings from Last 3 Encounters:  02/05/24 232 lb (105.2 kg)  10/30/23 232 lb (105.2 kg)  05/15/23 203 lb (92.1 kg)    Orthostatic VS for the past 24 hrs (Last 3 readings):  BP- Lying Pulse- Lying BP- Sitting Pulse- Sitting BP- Standing at 0 minutes Pulse- Standing at 0 minutes BP- Standing at 3 minutes Pulse-  Standing at 3 minutes  02/05/24 1151 107/70 65 115/79 67 108/76 66 101/71 73     GEN: Well nourished, well developed in no acute distress HEENT: Normal, moist mucous membranes NECK: No JVD CARDIAC: regular rhythm, normal S1 and S2, no rubs or gallops. No murmur. VASCULAR: Radial and DP pulses 2+ bilaterally. No carotid bruits RESPIRATORY:  Clear to auscultation without rales, wheezing or rhonchi  ABDOMEN: Soft, non-tender, non-distended MUSCULOSKELETAL:  Ambulates independently SKIN: Warm and dry, no edema NEUROLOGIC:  Alert and oriented x 3. No focal neuro deficits noted. PSYCHIATRIC:  Normal affect    ASSESSMENT AND PLAN: .    Intermittent sinus tachycardia Palpitations  Dysautonomia-like symptoms -reviewed her monitor together at length today -reviewed POTS--does not meet criteria -reviewed general spectrum of dysautonomia, known features, recommendations for management at length -reviewed management recommendations (see AVS), including hydration, salt, compression stockings, and gradual increase in cardiovascular activity  CV risk counseling and prevention -recommend heart healthy/Mediterranean diet, with whole grains, fruits, vegetable, fish, lean meats, nuts, and olive oil. Limit salt. -recommend moderate walking, 3-5 times/week for 30-50 minutes each session. Aim for at least 150 minutes.week. Goal should be pace of 3 miles/hours, or walking 1.5 miles in 30 minutes -recommend avoidance of tobacco products. Avoid excess alcohol. -ASCVD risk score: The 10-year ASCVD risk score (Arnett DK, et al.,  2019) is: 0.5%   Values used to calculate the score:     Age: 56 years     Sex: Female     Is Non-Hispanic African American: No     Diabetic: No     Tobacco smoker: No     Systolic Blood Pressure: 105 mmHg     Is BP treated: No     HDL Cholesterol: 53.5 mg/dL     Total Cholesterol: 199 mg/dL    Dispo: 6 mos or sooner as needed  Total time of encounter: I spent 42 minutes  dedicated to the care of this patient on the date of this encounter to include pre-visit review of records, face-to-face time with the patient discussing conditions above, and clinical documentation with the electronic health record. We specifically spent time today discussing test results, symptoms, dysautonomia spectrum, recommendations for management   Signed, Sheryle Donning, MD   Sheryle Donning, MD, PhD, [facility], [facility] [place]  [facility]  [place]  [facility] & V[facility] at [facility] at [facility] [address] [place], [place] [address] [phone]

## 2024-02-12 ENCOUNTER — Encounter: Payer: Self-pay | Admitting: Family

## 2024-02-12 ENCOUNTER — Encounter: Payer: Self-pay | Admitting: Neurology

## 2024-02-12 NOTE — Progress Notes (Signed)
 NEUROLOGY FOLLOW UP OFFICE NOTE  Amber Sweeney [id number]  Subjective:  Amber Sweeney is a 43 y.o. 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  To briefly review: Patient has had migraines since puberty. They went away for many years. Over the last year she has started having more headaches. She has 2-3 headaches per month that last 8-12 hours. She describes the pain as feeling like her head is being squeezed, mostly on the left, but also can include the right. She rates these as 6/10. There is associated photophobia and nausea, but no phonophobia. She wants to lay in a dark, quiet room when she gets this headache. The headache does not worsen with laying down. She has had blurry vision once, but otherwise denies vision changes. She denies aura.   She has a background headache 3 times per week. She describes this as pressure over her whole head. She denies neck pain. She rates it as a 2-3/10. There is no photophobia or nausea with this headache. She can work through this headache. There are periods of headache freedom.   She will take tylenol or ibuprofen 3 times per week. This helps with the background headache. It does not help with the more severe headaches. She does wait to see if headache will not go away. She will try to massage her pressure points or use peppermint oil. She will then take benadryl and reglan  and this will help with the more severe headache. She will sleep and the headache is usually gone in 45 minutes.   She has tried Imitrex once in the past (years ago) and made her headaches significantly worse. She did not take it ever again.   Patient also describes skin sensitivity in her arms, back, and head. She had it once on her legs. She feels like the nerves are on the surface of her skin.  Any touch really hurts. It will come on out of the blue and then resolves on its own in a few days. This has happened about 3 times in the last 1-2 years. Patient was given Lyria for this in the past, but she was not able to tolerate it (felt loopy). At one point, someone thought she had shingles and was given valtrex . She is not sure if this did anything.   Of note, patient is on prozac  20 mg for hot flashes (ovaries out) and clonazepam  0.5-1 mg BID PRN for anxiety.   Caffeine: 1-2 glasses of tea per day Smoker: Vapes EtOH use: none Family history of migraines: Mother has migraines Family history of other neurologic disease: daughter with epilepsy  Most recent Assessment and Plan (03/28/23): Amber Sweeney is a 43 y.o. female who presents for evaluation of headache and intermittent painful paresthesias of bilateral arms, back, and head. 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 Her neurological examination is essentially normal today. Patient's headaches sound consistent with migraine without aura, with 3 severe headaches per month and a less severe headache about 12 times per month. Her headaches may be complicated by medication overuse, as she takes tylenol or ibuprofen about 3 times per week. She has never been on a preventative medication. She is on Prozac  for hot flashes, so SSRI or TCA would be contraindicated. We discussed Topamax  and Propranolol . Given her low BP,  she would prefer to try Topamax . She has previously tried Imitrex for migraine rescue, but was not able to tolerate it (made headaches worse).    In terms of the intermittent painful paresthesias in her arms, back, and head, the etiology of these symptoms are unclear. There is no clear root or nerve distribution. Given that they occur infrequently, I'm not sure the cause. We agreed to monitor for now, but if symptoms recur, I may  consider imaging (MRI brain).   PLAN: -Blood work: B1, B12 -For migraines: Migraine prevention:  Topamax  25 mg daily Migraine rescue:  Maxalt  10 mg PRN at headache onset, can repeat after 2 hours Limit use of pain relievers to no more than 2 days out of week to prevent risk of rebound or medication-overuse headache. Keep headache diary   -Monitor arm symptoms for now. May consider further work up if symptoms recur  Since their last visit: Patient's B12 was borderline low, so B12 1000 mcg daily was recommended. She is taking a B complex.   Patient had palpitations on Topamax , so the decision was made to stop it. I started propranolol  20 mg BID on 04/26/23 given her history of palpitations. She is still on this.  Her headaches are doing well. She has only maybe had 2 headaches since last visit. She does get frequent auras (flashing lights), but this is not bothersome to her. When she gets a headache, Maxalt  works well. She has only taken this about 3 times.  The paresthesias have also improved, only happening once since last visit.  Patient has palpitations and near syncope. She has talked to cardiology about it. They do not think patient meets criteria for POTS but not she has intermittent tachycardia, palpitations, and dysautonomia like symptoms. Lifestyle modifications were given.  MEDICATIONS:  Outpatient Encounter Medications as of 02/15/2024  Medication Sig   Cholecalciferol  1.25 MG (50000 UT) capsule Take 1 capsule (50,000 Units total) by mouth every 30 (thirty) days. (Patient not taking: Reported on 02/15/2024)   Cholecalciferol  1.25 MG (50000 UT) capsule Take 1 capsule (50,000 Units total) by mouth every 30 (thirty) days.   clonazePAM  (KLONOPIN ) 0.5 MG tablet Take 1-2 tablets (0.5-1 mg total) by mouth 2 (two) times daily for anxiety   FLUoxetine  (PROZAC ) 20 MG capsule Take 1 capsule (20 mg total) by mouth every morning. (Patient not taking: Reported on 02/15/2024)   metoCLOPramide   (REGLAN ) 10 MG tablet Take 1 tablet (10 mg total) by mouth every 8 (eight) hours as needed for nausea.   Multiple Vitamin (MULTIVITAMIN) capsule Take 1 capsule by mouth daily.   propranolol  (INDERAL ) 20 MG tablet Take 1 tablet (20 mg total) by mouth 2 (two) times daily.   rizatriptan  (MAXALT ) 10 MG tablet Take 1 tablet (10 mg total) by mouth as needed for migraine. May repeat in 2 hours if needed   valACYclovir  (VALTREX ) 1000 MG tablet Take 1 tablet by mouth 3 (three) times daily for 7 days   No facility-administered encounter medications on file as of 02/15/2024.    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   Hashimoto's thyroiditis    Hemochromatosis     PAST SURGICAL HISTORY: Past Surgical History:  Procedure Laterality Date   CHOLECYSTECTOMY     Ovaries and tubes removed Bilateral    age 75   WISDOM TOOTH EXTRACTION      ALLERGIES: Allergies  Allergen Reactions   Iodinated Contrast Media Other (See  Comments)    Panic Attack   Cephalosporins Rash   Cyclosporine Hives   Sumatriptan Other (See Comments)   Celecoxib Other (See Comments)    Patient stated it gave her pancreatitis   Gabapentin  Palpitations   Levothyroxine Sodium Palpitations   Liraglutide -Weight Management Other (See Comments)    Injection site turned black for months   Pregabalin Diarrhea    Panic attack     Topiramate  Palpitations   Wellbutrin [Bupropion] Other (See Comments)    "I didn't care about anything"    FAMILY HISTORY: Family History  Problem Relation Age of Onset   Valvular heart disease Mother        Hx valve replacement   BRCA 1/2 Mother    Diabetes Father    Hashimoto's thyroiditis Sister    BRCA 1/2 Sister    Anemia Sister    Diabetes Brother    Breast cancer Maternal Aunt     SOCIAL HISTORY: Social History   Tobacco Use   Smoking status: Former    Current packs/day: 0.00    Types: Cigarettes    Quit date:  10/10/2018    Years since quitting: 5.3    Passive exposure: Past   Smokeless tobacco: Never   Tobacco comments:    Quit smoking 10/2018  Vaping Use   Vaping status: Every Day   Start date: 10/09/2018  Substance Use Topics   Alcohol use: Not Currently   Drug use: Not Currently   Social History   Social History Narrative   Are you right handed or left handed? right   Are you currently employed ?   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   Who lives with you? family   What type of home do you live in: 1 story or 2 story? one    Caffiene. rarely      Objective:  Vital Signs:  BP 109/76   Pulse 77   Ht 5\' 8"  (1.727 m)   Wt 236 lb (107 kg)   SpO2 100%   BMI 35.88 kg/m   General: No acute distress.  Patient appears well-groomed.   Head:  Normocephalic/atraumatic Neck: supple Lungs:  Non-labored breathing on room air  Neurological Exam: alert and oriented.  Speech fluent and not dysarthric, language intact.  CN II-XII intact. Bulk and tone normal, muscle strength 5/5 throughout.  Sensation to light touch intact.  Deep tendon reflexes 2+ throughout.  Finger to nose testing intact.  Gait normal.   Labs and Imaging review: New results: 03/28/23: B1 wnl B12: 295  Previously reviewed results: Lab Results  Component Value Date    HGBA1C 4.8 01/29/2023      Recent Labs[] Expand by Default       Lab Results  Component Value Date    TSH 2.09 01/29/2023      Iron studies (01/29/23): wnl (ferritin 50)   CMP (01/29/23): unremarkable CBC (01/29/23): unremarkable   Lipid panel (01/29/23): Component     Latest Ref Rng 01/29/2023  Cholesterol     0 - 200 mg/dL 191   Triglycerides     0.0 - 149.0 mg/dL 478.2   HDL Cholesterol     >39.00 mg/dL 95.62   VLDL     0.0 - 40.0 mg/dL 13.0   LDL (calc)     0 - 99 mg/dL 865 (H)   Total CHOL/HDL Ratio 4   NonHDL 145.12     Assessment/Plan:  This is Amber  Marthe Sweeney, a 43 y.o. female with migraines,  likely with aura. She tried topamax , but was unable to tolerate (caused palpitations). She is doing well with propranolol  20 mg BID with 2-3 headaches since last visit.  Plan: -For migraines: Migraine prevention:  Continue propranolol  20 mg BID Migraine rescue:  Maxalt  10 mg PRN at headache onset, can repeat after 2 hours  Limit use of pain relievers to no more than 2 days out of week to prevent risk of rebound or medication-overuse headache. Keep headache diary -Orthostatic intolerance recommendations given as patient mentioned POTS like symptoms that is followed by cardiology  Return to clinic in 6 months  Rommie Coats, MD

## 2024-02-15 ENCOUNTER — Other Ambulatory Visit (HOSPITAL_BASED_OUTPATIENT_CLINIC_OR_DEPARTMENT_OTHER): Payer: Self-pay

## 2024-02-15 ENCOUNTER — Ambulatory Visit (INDEPENDENT_AMBULATORY_CARE_PROVIDER_SITE_OTHER): Admitting: Neurology

## 2024-02-15 ENCOUNTER — Encounter: Payer: Self-pay | Admitting: Neurology

## 2024-02-15 ENCOUNTER — Encounter: Payer: Self-pay | Admitting: Family

## 2024-02-15 ENCOUNTER — Other Ambulatory Visit (HOSPITAL_COMMUNITY): Payer: Self-pay

## 2024-02-15 ENCOUNTER — Other Ambulatory Visit: Payer: Self-pay

## 2024-02-15 VITALS — BP 109/76 | HR 77 | Ht 68.0 in | Wt 236.0 lb

## 2024-02-15 DIAGNOSIS — G901 Familial dysautonomia [Riley-Day]: Secondary | ICD-10-CM

## 2024-02-15 DIAGNOSIS — G43009 Migraine without aura, not intractable, without status migrainosus: Secondary | ICD-10-CM | POA: Diagnosis not present

## 2024-02-15 DIAGNOSIS — R209 Unspecified disturbances of skin sensation: Secondary | ICD-10-CM | POA: Diagnosis not present

## 2024-02-15 NOTE — Patient Instructions (Addendum)
 -For migraines: Migraine prevention:  Continue propranolol  20 mg BID Migraine rescue:  Maxalt  10 mg PRN at headache onset, can repeat after 2 hours  Limit use of pain relievers to no more than 2 days out of week to prevent risk of rebound or medication-overuse headache. Keep headache diary   Return to clinic in 6 months   Guidelines for the Nonpharmacological Treatment of Orthostatic Hypotension/ Orthostatic Tachycardia/ Orthostatic Intolerance  Make all postural changes from lying to sitting or sitting to standing, slowly.  Drink 2.0-2.5 L of fluid per day (if okay with your other doctors).  Increase sodium in the diet to 3-5 g per day (if okay with your other doctors).  Avoid large meals which can cause low blood pressure during digestion. It is better to eat smaller meals more often than 3 large meals.  Avoid alcohol. Alcohol can cause blood to pool in the legs which may worsen low blood pressure reactions when standing.  Perform lower extremity exercises to improve strength of the leg muscles. This will help prevent blood from pooling in the legs when standing and walking. Raise the head of the bed by 6-10 inches. The entire bed must be at an angle. Raising only the head portion of the bed at the waist level or using pillows will not be effective. Raising the head of the bed will reduce urine formation overnight and there will be more volume in the circulation in the morning. It may also help orthostatic tolerance during the day.  During bad days or prior to engaging in more physical activity than usual, drink 500 ml of water quickly. This will result in increased blood pressure within 5 minutes of drinking the water. The effect will last up to a few hours and may improve orthostatic intolerance.  Use custom-fitted elastic support stockings. This will reduce a tendency for blood to pool in the legs when standing and may improve orthostatic intolerance. Abdominal binder or "SPANX" may  also be useful.  Use physical counter-maneuvers such as leg crossing, squatting, or raising and resting the leg on a chair. These maneuvers increase blood pressure and can improve orthostatic intolerance.  Gently escalated aerobic physical activity program for graded reconditioning (see details below)  Home-based Exercise Plan for Patients with Orthostatic Intolerance (as may be seen in POTS and related disorders)  Level 1 - Reclined Gentle Movements This is the starting point for those patients most severely disabled by an autonomic disorder. If you are bedridden, this is your first step. We have known dysautonomia patients who were bedridden for years, and we able to work their way from "barely able to move" to biking 45 minutes a day, everyday. It will be hard. It may make you feel worse in the beginning. But the human body was meant to move. Just take baby steps until you can make it to your goal. Some patients can only do one minute a day, or one minute at a time, a few times a day. And they do this every day for a week, and then they increase to two minutes a day the next week. This is a very slow process, but improving your health slowly is better than not improving it at all. Helpful exercises may include:  Leg Pillow Squeeze - while laying down or reclined in bed, put a pillow folded between your knees and squeeze. Hold it for 10 seconds. Repeat.  Arm Pillow Squeeze - put the pillow folded between your palms and squeeze together as though  you were putting your hands into a praying position. Hold it for 10 seconds. Repeat.  Alphabet Toes - while laying in bed, write your name in the air with your toes. If you can build up your strength, write the whole alphabet. Do this several times a day.  Side Leg Lifts - while laying on your side, lift your leg up sideways and then bring your leg back down, without touching your legs together. Repeat.  Front Leg Lifts - While laying on your back, life  your left leg up, pointing your toe towards the ceiling. Repeat. Switch to right leg.  Gentle Stretching - any kind of stretching helps move blood around in the body and takes stress of your joints if you have been sitting or laying in the same position for a long time. Go through the entire body doing mild stretches, from feet, to legs, to back, to arms, to neck. Doing this when you wake up can be a great way to start the day, and repeating your stretches before bed can help you relax and sleep better.  Level 2 - Recumbent Cardio Exercises This is probably the level that most patients will be able to begin with, although everyone can benefit from the gentle stretching and toning exercises described in Level 1.  Always begin your workout with 5-10 minutes of stretching and/or yoga to warm up your muscles and protect your joints from injury.  Since the point of these exercises is to get your cardiovascular system to be more efficient, you will want to set a target heart rate for your workout. You should speak to your doctor about this because medications and other medical conditions can impact your target heart rate, but most patients can tolerate a workout at 75% to 80% of their maximum heart rate. [facility] has a Target Heart Rate Calculator you can use as a guide when speaking with your doctor.  You may want to purchase an exercise heart rate monitor to wear during your workouts to help you keep your heart rate within your target zone. We do not endorse any specific products, but exercise heart rate monitors with chest straps are usually more accurate than pulse oximeters you place on your finger. This is especially so for dysautonomia patients who have abnormalities in peripheral blood flow (common in some forms of dysautonomia), as this is more likely to give an inaccurate reading using a finger based monitor.  Suggested reclined cardiovascular exercises include:  Rowing - use a rowing machine,  or if you are feeling well enough, a kayak. You may want to start out slow, maybe 2-5 minutes a day. At your own pace, adding a few minutes per week, try to work your way up to 45 minutes per day, 5 days a week, with 30 minutes of your routine done in your target heart rate zone. Be sure to warm up at the beginning and cool down at the end.  Recumbent Biking - recumbent exercise bikes are different than regular exercise bikes. They seat the rider in a reclined position, rather than upright. Try recumbent biking a few minutes a day, adding a few minutes each week, until you can work out 45 minutes a day, five days a week, with 30 minutes of that workout in your target heart rate zone. Be sure to warm up at the beginning and cool down at the end of each workout.  Swimming - The pressure from water helps prevent orthostatic symptoms. Dysautonomia patients who have  been bedridden for years may be able to stand upright for an hour in a pool, because the pressure from the water prevents orthostatic symptoms from occurring, or lessens their impact. Dysautonomia patients can take advantage of this to get a good cardio workout, or to focus on stretching and strength training in the water. Always swim with a spotter or a buddy who can keep and eye on you, just in case you develop lightheadedness or other symptoms that would make it unsafe to be in a pool. It may be best to start your swimming exercise program at a pool with a lifeguard, or with a Physical Therapist who specializes in aquatic therapy. A good old fashioned kick board can be a great tool for dysautonomia patients. You can kick your way around the pool, which gives you a good cardio workout, and all that kicking helps strengthen your legs. Toning up your legs and core is a great way to minimize orthostatic symptoms.  Weight Training - Most dysautonomia patients can benefit from overall increase in tone and strength. The stronger our muscles are, the more  efficiently they use oxygen, the better we will be able to tolerate orthostatic stress. Special emphasis can be placed on strengthening leg and core muscles. Some patients find it useful to wear 2 to 4 lb. weights that attach to the ankles with velcro. This can really help with leg strength if you wear them often enough. There are also machines at the gym that assist with core and leg weight training. Begin with light weights, and use them in a reclined or seated position. Some patients become extra symptomatic when lifting their arms over their head, so take extra care if attempting to do that.  Level 3 - Normal Workouts Some dysautonomia patients are able to jog, run marathons or walk several miles a week. These patients should do whatever they can to continue these activities. Dysautonomia patients who are well-conditioned should exercise 45 minutes a day, at least 3 days per week. Special emphasis should be placed on leg and core strength, and cardiovascular exercises.

## 2024-02-18 ENCOUNTER — Other Ambulatory Visit: Payer: Self-pay

## 2024-03-09 ENCOUNTER — Encounter: Payer: Self-pay | Admitting: Family

## 2024-03-13 ENCOUNTER — Ambulatory Visit: Admitting: Family Medicine

## 2024-03-13 ENCOUNTER — Telehealth: Admitting: Physician Assistant

## 2024-03-13 DIAGNOSIS — B3731 Acute candidiasis of vulva and vagina: Secondary | ICD-10-CM

## 2024-03-13 MED ORDER — FLUCONAZOLE 150 MG PO TABS: 150.0000 mg | ORAL_TABLET | Freq: Once | ORAL | 0 refills | Status: AC

## 2024-03-13 NOTE — Progress Notes (Signed)

## 2024-03-20 ENCOUNTER — Ambulatory Visit (HOSPITAL_BASED_OUTPATIENT_CLINIC_OR_DEPARTMENT_OTHER): Admitting: Cardiology

## 2024-04-07 ENCOUNTER — Encounter: Payer: Self-pay | Admitting: Family

## 2024-04-16 ENCOUNTER — Other Ambulatory Visit (HOSPITAL_COMMUNITY): Payer: Self-pay

## 2024-04-16 ENCOUNTER — Encounter: Payer: Self-pay | Admitting: Family

## 2024-04-21 ENCOUNTER — Encounter: Admitting: Physician Assistant

## 2024-05-02 ENCOUNTER — Ambulatory Visit (INDEPENDENT_AMBULATORY_CARE_PROVIDER_SITE_OTHER): Admitting: Family Medicine

## 2024-05-02 ENCOUNTER — Encounter: Payer: Self-pay | Admitting: Family Medicine

## 2024-05-02 VITALS — BP 109/65 | HR 74 | Ht 68.0 in | Wt 239.0 lb

## 2024-05-02 DIAGNOSIS — N644 Mastodynia: Secondary | ICD-10-CM

## 2024-05-02 DIAGNOSIS — E559 Vitamin D deficiency, unspecified: Secondary | ICD-10-CM

## 2024-05-02 DIAGNOSIS — Z1509 Genetic susceptibility to other malignant neoplasm: Secondary | ICD-10-CM

## 2024-05-02 DIAGNOSIS — Z1501 Genetic susceptibility to malignant neoplasm of breast: Secondary | ICD-10-CM | POA: Diagnosis not present

## 2024-05-02 DIAGNOSIS — Z833 Family history of diabetes mellitus: Secondary | ICD-10-CM

## 2024-05-02 DIAGNOSIS — R635 Abnormal weight gain: Secondary | ICD-10-CM | POA: Diagnosis not present

## 2024-05-02 DIAGNOSIS — R5383 Other fatigue: Secondary | ICD-10-CM

## 2024-05-02 DIAGNOSIS — Z1283 Encounter for screening for malignant neoplasm of skin: Secondary | ICD-10-CM

## 2024-05-02 DIAGNOSIS — Z6835 Body mass index (BMI) 35.0-35.9, adult: Secondary | ICD-10-CM

## 2024-05-02 LAB — T4, FREE: Free T4: 0.81 ng/dL (ref 0.60–1.60)

## 2024-05-02 LAB — CBC WITH DIFFERENTIAL/PLATELET
Basophils Absolute: 0 K/uL (ref 0.0–0.1)
Basophils Relative: 0.9 % (ref 0.0–3.0)
Eosinophils Absolute: 0.1 K/uL (ref 0.0–0.7)
Eosinophils Relative: 2 % (ref 0.0–5.0)
HCT: 41.3 % (ref 36.0–46.0)
Hemoglobin: 14 g/dL (ref 12.0–15.0)
Lymphocytes Relative: 33.7 % (ref 12.0–46.0)
Lymphs Abs: 1.9 K/uL (ref 0.7–4.0)
MCHC: 34 g/dL (ref 30.0–36.0)
MCV: 94.1 fl (ref 78.0–100.0)
Monocytes Absolute: 0.4 K/uL (ref 0.1–1.0)
Monocytes Relative: 6.4 % (ref 3.0–12.0)
Neutro Abs: 3.3 K/uL (ref 1.4–7.7)
Neutrophils Relative %: 57 % (ref 43.0–77.0)
Platelets: 273 K/uL (ref 150.0–400.0)
RBC: 4.39 Mil/uL (ref 3.87–5.11)
RDW: 13.2 % (ref 11.5–15.5)
WBC: 5.7 K/uL (ref 4.0–10.5)

## 2024-05-02 LAB — COMPREHENSIVE METABOLIC PANEL WITH GFR
ALT: 21 U/L (ref 0–35)
AST: 15 U/L (ref 0–37)
Albumin: 4.6 g/dL (ref 3.5–5.2)
Alkaline Phosphatase: 61 U/L (ref 39–117)
BUN: 17 mg/dL (ref 6–23)
CO2: 29 meq/L (ref 19–32)
Calcium: 9.3 mg/dL (ref 8.4–10.5)
Chloride: 103 meq/L (ref 96–112)
Creatinine, Ser: 0.76 mg/dL (ref 0.40–1.20)
GFR: 95.82 mL/min (ref >60.00)
Glucose, Bld: 88 mg/dL (ref 70–99)
Potassium: 4.2 meq/L (ref 3.5–5.1)
Sodium: 140 meq/L (ref 135–145)
Total Bilirubin: 0.4 mg/dL (ref 0.2–1.2)
Total Protein: 7.1 g/dL (ref 6.0–8.3)

## 2024-05-02 LAB — TSH: TSH: 3.24 u[IU]/mL (ref 0.35–5.50)

## 2024-05-02 LAB — IBC + FERRITIN
Ferritin: 57.6 ng/mL (ref 10.0–291.0)
Iron: 127 ug/dL (ref 42–145)
Saturation Ratios: 38.1 % (ref 20.0–50.0)
TIBC: 333.2 ug/dL (ref 250.0–450.0)
Transferrin: 238 mg/dL (ref 212.0–360.0)

## 2024-05-02 LAB — B12 AND FOLATE PANEL
Folate: 18.5 ng/mL (ref >5.9)
Vitamin B-12: 453 pg/mL (ref 211–911)

## 2024-05-02 LAB — VITAMIN D 25 HYDROXY (VIT D DEFICIENCY, FRACTURES): VITD: 28.44 ng/mL — ABNORMAL LOW (ref 30.00–100.00)

## 2024-05-02 NOTE — Progress Notes (Signed)
 Acute Office Visit  Subjective:     Patient ID: [name], female    DOB: [birth year], 43 y.o.   MRN: [id number]  Chief Complaint  Patient presents with   Follow-up    I just want all my labs done Onset 05/01/2024 pain in my L breast     HPI Patient is in today for fatigue and breast pain.   Discussed the use of AI scribe software for clinical note transcription with the patient, who gave verbal consent to proceed.  History of Present Illness JAYLEN CLAUDE is a 43 year old female who presents with fatigue and left breast tenderness.  She experiences persistent fatigue, describing it as being tired 'all the time.' She is unsure if this fatigue is more than normal and has not had recent blood work. She is interested in checking her thyroid , iron levels, and other labs to investigate the cause of her fatigue. She mentions giving blood later today and notes that she should be donating every 54 days but does not adhere to this schedule - hx hemochromatosis.   She is currently taking a monthly dose of 50,000 IU of vitamin D , prescribed by a different doctor, and is not taking any additional daily vitamin D  supplements. She also takes propranolol  as needed for migraines and tachycardia, and clonazepam  as needed. She previously took Prozac  but is no longer on it. She takes lisinopril 20 mg once daily, and sometimes takes it twice a day.  She reports tenderness in her left breast this week, specifically behind the nipple, which she describes as 'super sensitive all the time, but worse when touched.' She had a normal mammogram in January of this year. No recent trauma, swelling, rashes, lumps, bumps, or nipple discharge. The tenderness is rated as a 3 out of 10 in severity.  She mentions a family history of breast issues, noting that her sister undergoes ultrasounds and MRIs for monitoring. Patient is BRCA+. She has regular annual mammograms. She has already had her ovaries  removed.  She reports weight gain despite no changes in diet or exercise, and her children have mentioned that she snores, but there have been no reports of her stopping breathing during sleep. She is concerned that her thyroid  may be contributing to her symptoms.         ROS All review of systems negative except what is listed in the HPI      Objective:    BP 109/65 (BP Location: Right Arm, Patient Position: Sitting, Cuff Size: Large)   Pulse 74   Ht 5' 8 (1.727 m)   Wt 239 lb (108.4 kg)   SpO2 100%   BMI 36.34 kg/m    Physical Exam Vitals reviewed.  Constitutional:      Appearance: Normal appearance. She is obese.  Cardiovascular:     Rate and Rhythm: Normal rate and regular rhythm.     Heart sounds: Normal heart sounds.  Pulmonary:     Effort: Pulmonary effort is normal.     Breath sounds: Normal breath sounds.  Chest:  Breasts:    Left: Tenderness (behind nipple) present. No swelling, bleeding, inverted nipple, mass, nipple discharge or skin change.  Lymphadenopathy:     Upper Body:     Left upper body: No supraclavicular, axillary or pectoral adenopathy.  Skin:    General: Skin is warm and dry.  Neurological:     Mental Status: She is alert and oriented to person, place, and time.  Psychiatric:  Mood and Affect: Mood normal.        Behavior: Behavior normal.        Thought Content: Thought content normal.        Judgment: Judgment normal.       No results found for any visits on 05/02/24.      Assessment & Plan:   Problem List Items Addressed This Visit       Active Problems   Hereditary hemochromatosis (HCC)   Requires regular blood donations. Monitor iron levels as part of fatigue assessment. - Order iron studies. - Encourage regular blood donations       Relevant Orders   CBC with Differential/Platelet   IBC + Ferritin   BRCA2 positive   Left breast nipple tenderness without lumps, rashes, or discharge. Recent normal  mammogram in January. Diagnostic mammogram with ultrasound planned. - Order diagnostic mammogram with ultrasound for the left breast.      Relevant Orders   MM 3D DIAGNOSTIC MAMMOGRAM UNILATERAL LEFT BREAST   US  BREAST COMPLETE UNI LEFT INC AXILLA   Vitamin D  deficiency   On monthly high-dose vitamin D  regimen. Assess current levels for dosage adjustment. - Check current vitamin D  levels. - Adjust vitamin D  dosage if levels pending lab results      Relevant Orders   VITAMIN D  25 Hydroxy (Vit-D Deficiency, Fractures)   Other Visit Diagnoses       Fatigue, unspecified type    -  Primary Weight gain Chronic fatigue possibly linked to thyroid  dysfunction, vitamin B12 deficiency, or iron levels/hemochromatosis. Weight gain may contribute. - Order labs today. - Consider sleep study if no other cause for fatigue is identified.   Relevant Orders   CBC with Differential/Platelet   Comprehensive metabolic panel with GFR   B12 and Folate Panel   IBC + Ferritin   TSH   T4, free     Breast pain, left       Relevant Orders   MM 3D DIAGNOSTIC MAMMOGRAM UNILATERAL LEFT BREAST   US  BREAST COMPLETE UNI LEFT INC AXILLA                   No orders of the defined types were placed in this encounter.   Return for - pending results or sooner if needed.  Waddell KATHEE Mon, NP

## 2024-05-02 NOTE — Assessment & Plan Note (Signed)
 On monthly high-dose vitamin D  regimen. Assess current levels for dosage adjustment. - Check current vitamin D  levels. - Adjust vitamin D  dosage if levels pending lab results

## 2024-05-02 NOTE — Assessment & Plan Note (Signed)
 Requires regular blood donations. Monitor iron levels as part of fatigue assessment. - Order iron studies. - Encourage regular blood donations

## 2024-05-02 NOTE — Assessment & Plan Note (Signed)
 Left breast nipple tenderness without lumps, rashes, or discharge. Recent normal mammogram in January. Diagnostic mammogram with ultrasound planned. - Order diagnostic mammogram with ultrasound for the left breast.

## 2024-05-05 ENCOUNTER — Ambulatory Visit (INDEPENDENT_AMBULATORY_CARE_PROVIDER_SITE_OTHER)

## 2024-05-05 ENCOUNTER — Ambulatory Visit: Payer: Self-pay | Admitting: Family Medicine

## 2024-05-05 ENCOUNTER — Encounter: Payer: Self-pay | Admitting: Family Medicine

## 2024-05-05 DIAGNOSIS — E66812 Obesity, class 2: Secondary | ICD-10-CM

## 2024-05-05 DIAGNOSIS — Z833 Family history of diabetes mellitus: Secondary | ICD-10-CM | POA: Diagnosis not present

## 2024-05-05 DIAGNOSIS — Z6835 Body mass index (BMI) 35.0-35.9, adult: Secondary | ICD-10-CM

## 2024-05-05 DIAGNOSIS — R635 Abnormal weight gain: Secondary | ICD-10-CM

## 2024-05-05 LAB — HEMOGLOBIN A1C: Hgb A1c MFr Bld: 5.9 % (ref 4.6–6.5)

## 2024-05-05 NOTE — Telephone Encounter (Signed)
 Add on lab sheet completed. Will let us  know if they aren't able to add.

## 2024-05-05 NOTE — Addendum Note (Signed)
 Addended by: ALMARIE BIRMINGHAM B on: 05/05/2024 08:16 AM   Modules accepted: Orders

## 2024-05-09 ENCOUNTER — Ambulatory Visit
Admission: RE | Admit: 2024-05-09 | Discharge: 2024-05-09 | Disposition: A | Discharge status: Home / Self Care | Source: Ambulatory Visit | Attending: Family Medicine | Admitting: Family Medicine

## 2024-05-09 ENCOUNTER — Ambulatory Visit

## 2024-05-09 DIAGNOSIS — R928 Other abnormal and inconclusive findings on diagnostic imaging of breast: Secondary | ICD-10-CM | POA: Diagnosis not present

## 2024-05-09 DIAGNOSIS — R92322 Mammographic fibroglandular density, left breast: Secondary | ICD-10-CM | POA: Diagnosis not present

## 2024-05-30 ENCOUNTER — Other Ambulatory Visit: Payer: Self-pay | Admitting: Medical Genetics

## 2024-05-30 DIAGNOSIS — Z006 Encounter for examination for normal comparison and control in clinical research program: Secondary | ICD-10-CM

## 2024-06-10 ENCOUNTER — Encounter: Payer: Self-pay | Admitting: Family

## 2024-06-17 ENCOUNTER — Other Ambulatory Visit

## 2024-06-26 ENCOUNTER — Other Ambulatory Visit

## 2024-07-01 ENCOUNTER — Other Ambulatory Visit (HOSPITAL_COMMUNITY): Payer: Self-pay

## 2024-07-01 ENCOUNTER — Other Ambulatory Visit: Payer: Self-pay | Admitting: Neurology

## 2024-07-01 ENCOUNTER — Encounter: Payer: Self-pay | Admitting: Family

## 2024-07-01 DIAGNOSIS — G43009 Migraine without aura, not intractable, without status migrainosus: Secondary | ICD-10-CM

## 2024-07-01 MED ORDER — VITAMIN D 1.25 MG (50000 UT) PO CAPS
50000.0000 [IU] | ORAL_CAPSULE | ORAL | 1 refills | Status: DC
  Filled 2024-07-01: qty 3, 90d supply, fill #0

## 2024-07-01 MED ORDER — PROPRANOLOL HCL 20 MG PO TABS
20.0000 mg | ORAL_TABLET | Freq: Two times a day (BID) | ORAL | 1 refills | Status: AC
Start: 2024-07-01 — End: ?
  Filled 2024-07-01: qty 60, 30d supply, fill #0
  Filled 2024-07-29: qty 60, 30d supply, fill #1

## 2024-07-02 ENCOUNTER — Other Ambulatory Visit: Payer: Self-pay

## 2024-07-04 ENCOUNTER — Encounter (HOSPITAL_BASED_OUTPATIENT_CLINIC_OR_DEPARTMENT_OTHER): Payer: Self-pay | Admitting: Certified Nurse Midwife

## 2024-07-04 ENCOUNTER — Ambulatory Visit (HOSPITAL_BASED_OUTPATIENT_CLINIC_OR_DEPARTMENT_OTHER): Admitting: Certified Nurse Midwife

## 2024-07-04 ENCOUNTER — Encounter: Payer: Self-pay | Admitting: Family

## 2024-07-04 ENCOUNTER — Other Ambulatory Visit: Payer: Self-pay

## 2024-07-04 ENCOUNTER — Other Ambulatory Visit (HOSPITAL_COMMUNITY)
Admission: RE | Admit: 2024-07-04 | Discharge: 2024-07-04 | Disposition: A | Discharge status: Home / Self Care | Source: Ambulatory Visit | Attending: Certified Nurse Midwife | Admitting: Certified Nurse Midwife

## 2024-07-04 ENCOUNTER — Other Ambulatory Visit (HOSPITAL_COMMUNITY): Payer: Self-pay

## 2024-07-04 VITALS — BP 114/67 | HR 67 | Ht 68.0 in | Wt 239.6 lb

## 2024-07-04 DIAGNOSIS — Z01419 Encounter for gynecological examination (general) (routine) without abnormal findings: Secondary | ICD-10-CM | POA: Diagnosis not present

## 2024-07-04 DIAGNOSIS — Z780 Asymptomatic menopausal state: Secondary | ICD-10-CM

## 2024-07-04 DIAGNOSIS — N852 Hypertrophy of uterus: Secondary | ICD-10-CM

## 2024-07-04 DIAGNOSIS — Z1509 Genetic susceptibility to other malignant neoplasm: Secondary | ICD-10-CM | POA: Diagnosis not present

## 2024-07-04 DIAGNOSIS — Z124 Encounter for screening for malignant neoplasm of cervix: Secondary | ICD-10-CM

## 2024-07-04 DIAGNOSIS — Z6836 Body mass index (BMI) 36.0-36.9, adult: Secondary | ICD-10-CM

## 2024-07-04 DIAGNOSIS — C50919 Malignant neoplasm of unspecified site of unspecified female breast: Secondary | ICD-10-CM | POA: Insufficient documentation

## 2024-07-04 DIAGNOSIS — Z1501 Genetic susceptibility to malignant neoplasm of breast: Secondary | ICD-10-CM | POA: Diagnosis not present

## 2024-07-04 MED ORDER — PROGESTERONE 200 MG PO CAPS
200.0000 mg | ORAL_CAPSULE | Freq: Every day | ORAL | 12 refills | Status: DC
  Filled 2024-07-04: qty 30, 30d supply, fill #0

## 2024-07-04 MED ORDER — VITAMIN D (ERGOCALCIFEROL) 1.25 MG (50000 UNIT) PO CAPS
50000.0000 [IU] | ORAL_CAPSULE | ORAL | 12 refills | Status: AC
  Filled 2024-07-04: qty 12, 84d supply, fill #0
  Filled 2024-09-21: qty 12, 84d supply, fill #1

## 2024-07-04 NOTE — Progress Notes (Signed)
 Weight gain-send lipo Vit D weekly Pelvic US  Prog? Dec libido but enjoys sex, no dryness Vag hyaluronic  Levon is a 42yo [id number] here to establish care. Pt underwent bilateral salpingectomy/oophorectomy approx 10 years ago to reduce her risk of ovarian cancer. Since that time, she has experienced hot flashes, night sweats, weight gain, and lack of libido.  She is in a relationship with a long term monogamous partner. She denies vaginal dryness with sex and is able to enjoy sex and have orgasm, but states I just don't get in the mood. Pt reports he is very supportive and they have a loving relationship.  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 Pt works full-time.  Pt previously diagnosed with BRCA2 and is aware that bilateral mastectomy is recommended. She is saving her PTO time and plans to try to have bilateral mastectomy when possible. She does work from home.  Patient Active Problem List    Diagnosis Date Noted   BRCA gene positive 11/22/2015      Priority: High      Class: Acute   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   Situational stress 12/01/2013   Borderline hyperglycemia 11/03/2013   FH: diabetes mellitus 11/03/2013   Family history of breast cancer 10/20/2013   Menorrhagia 10/20/2013   Obesity 10/10/2012    Past Medical History:  Diagnosis Date   Anxiety     Cervical high risk HPV (human papillomavirus) test positive 1999   Gall stone     Gall stones     Menorrhagia     Obesity (BMI 30-39.9)     Tobacco abuse, episodic      Past Surgical History:  Procedure Laterality Date   Bilateral salpingoophorectomy   2015   Cholecystectomy   01/31/13   Endometrial ablation   10/24/13    [facility] Dr.TNM   Wisdom tooth extraction          43 y.o. [id number] Single White or Caucasian female here for annual exam.    No LMP recorded. (Menstrual status: Oophorectomy).          Sexually active: Yes.    The current method of family planning is BSO     Exercising: Yes.     Smoker:  no  Health Maintenance: Pap:  Pt desires pap today MMG:  Mammogram UTD, discussed availability Breast MRI (now)    reports that she quit smoking about 5 years ago. Her smoking use included cigarettes. She has been exposed to tobacco smoke. She has never used smokeless tobacco. She reports that she does not currently use alcohol. She reports that she does not currently use drugs.  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   Hashimoto's thyroiditis    Hemochromatosis     Past Surgical History:  Procedure Laterality Date   CHOLECYSTECTOMY     Ovaries and tubes removed Bilateral    age 28   WISDOM TOOTH EXTRACTION      Current Outpatient Medications  Medication Sig Dispense Refill   Cholecalciferol  (VITAMIN D ) 1.25 MG (50000 UT) CAPS Take 1 capsule by mouth every 30 (thirty) days. 3 capsule 1   clonazePAM  (KLONOPIN ) 0.5 MG tablet Take 1-2 tablets (0.5-1 mg total) by mouth 2 (two) times daily for anxiety 120 tablet 0   metoCLOPramide  (REGLAN ) 10 MG tablet Take 1 tablet (10 mg total) by mouth every 8 (eight) hours as needed for nausea. 30 tablet 0  Multiple Vitamin (MULTIVITAMIN) capsule Take 1 capsule by mouth daily.     progesterone  (PROMETRIUM ) 200 MG capsule Take 1 capsule (200 mg total) by mouth at bedtime. 30 capsule 12   propranolol  (INDERAL ) 20 MG tablet Take 1 tablet (20 mg total) by mouth 2 (two) times daily. 60 tablet 1   rizatriptan  (MAXALT ) 10 MG tablet Take 1 tablet (10 mg total) by mouth as needed for migraine. May repeat in 2 hours if needed 10 tablet 5   valACYclovir  (VALTREX ) 1000 MG tablet Take 1 tablet by mouth 3 (three) times daily for 7 days 21 tablet 2   Vitamin D , Ergocalciferol , (DRISDOL ) 1.25 MG (50000 UNIT) CAPS capsule Take 1 capsule (50,000 Units total) by mouth every 7 (seven) days. 12 capsule 12   No current facility-administered medications for this visit.    Family History  Problem  Relation Age of Onset   Valvular heart disease Mother        Hx valve replacement   BRCA 1/2 Mother    Diabetes Father    Hashimoto's thyroiditis Sister    BRCA 1/2 Sister    Anemia Sister    Diabetes Brother    Breast cancer Maternal Aunt     ROS: Constitutional: positive for weight gain Genitourinary:negative  Exam:   BP 114/67   Pulse 67   Ht 5' 8 (1.727 m) Comment: Reported  Wt 239 lb 9.6 oz (108.7 kg)   BMI 36.43 kg/m   Height: 5' 8 (172.7 cm) (Reported)  General appearance: alert, cooperative and appears stated age Head: Normocephalic, without obvious abnormality, atraumatic Lungs: clear to auscultation bilaterally Breasts: Pt declined breast exam today (recent mammogram per pt) Abdomen: soft, non-tender; bowel sounds normal; no masses,  no organomegaly Extremities: extremities normal, atraumatic, no cyanosis or edema Skin: Skin color, texture, turgor normal. No rashes or lesions Lymph nodes: Cervical, supraclavicular, and axillary nodes normal. No abnormal inguinal nodes palpated Neurologic: Grossly normal   Pelvic: External genitalia:  no lesions              Urethra:  normal appearing urethra with no masses, tenderness or lesions              Bartholins and Skenes: normal                 Vagina: normal appearing vagina with normal color and no discharge, no lesions              Cervix: multiparous appearance              Pap taken: Yes.   Bimanual Exam:  Uterus:  enlarged, 8 weeks size              Adnexa: no mass, fullness, tenderness              Anus:  normal sphincter tone, no lesions  Chaperone,  CMA, was present for exam.  Assessment/Plan:  1. Encounter for annual routine gynecological examination - Routine pap smear collected   2. Familial cancer of breast, unspecified laterality (HCC) (Primary) - Pt agreeable to Breast MRI annually (6 months after Mammogram). Order placed. - MR BREAST BILATERAL W WO CONTRAST INC CAD; Future  3. BRCA gene  mutation positive - MR BREAST BILATERAL W WO CONTRAST INC CAD; Future  4. Cervical cancer screening - Cytology - PAP( [place])  5. Bulky or enlarged uterus - US  PELVIC COMPLETE WITH TRANSVAGINAL; Future  6. Postmenopausal - DG BONE DENSITY (DXA); Future  Pt desiring weight loss (BMI 36) and has tried healthy diet, exercise. Having difficulty losing weight. She lost weight in the past w/ Wegovy /GLP-1 but insurance does not currently cover Semaglutide . Pt considering compounded Tirzepatide. Arland MARLA Roller

## 2024-07-07 ENCOUNTER — Encounter (HOSPITAL_BASED_OUTPATIENT_CLINIC_OR_DEPARTMENT_OTHER): Payer: Self-pay | Admitting: Certified Nurse Midwife

## 2024-07-08 ENCOUNTER — Other Ambulatory Visit (HOSPITAL_COMMUNITY): Payer: Self-pay

## 2024-07-08 ENCOUNTER — Ambulatory Visit (HOSPITAL_BASED_OUTPATIENT_CLINIC_OR_DEPARTMENT_OTHER): Payer: Self-pay | Admitting: Certified Nurse Midwife

## 2024-07-08 ENCOUNTER — Other Ambulatory Visit (HOSPITAL_BASED_OUTPATIENT_CLINIC_OR_DEPARTMENT_OTHER): Payer: Self-pay | Admitting: Certified Nurse Midwife

## 2024-07-08 DIAGNOSIS — E063 Autoimmune thyroiditis: Secondary | ICD-10-CM

## 2024-07-08 LAB — CYTOLOGY - PAP
Comment: NEGATIVE
Diagnosis: NEGATIVE
High risk HPV: NEGATIVE

## 2024-07-08 MED ORDER — THYROID 15 MG PO TABS
15.0000 mg | ORAL_TABLET | Freq: Every day | ORAL | 0 refills | Status: DC
  Filled 2024-07-08: qty 90, 90d supply, fill #0

## 2024-07-10 ENCOUNTER — Other Ambulatory Visit

## 2024-07-10 ENCOUNTER — Other Ambulatory Visit (HOSPITAL_BASED_OUTPATIENT_CLINIC_OR_DEPARTMENT_OTHER): Payer: Self-pay

## 2024-07-10 ENCOUNTER — Other Ambulatory Visit (HOSPITAL_COMMUNITY): Payer: Self-pay

## 2024-07-10 DIAGNOSIS — Z1589 Genetic susceptibility to other disease: Secondary | ICD-10-CM

## 2024-07-10 DIAGNOSIS — C50919 Malignant neoplasm of unspecified site of unspecified female breast: Secondary | ICD-10-CM

## 2024-07-10 MED ORDER — THYROID 15 MG PO TABS
15.0000 mg | ORAL_TABLET | Freq: Every day | ORAL | 0 refills | Status: DC
  Filled 2024-07-10: qty 90, 90d supply, fill #0

## 2024-07-11 ENCOUNTER — Other Ambulatory Visit (HOSPITAL_BASED_OUTPATIENT_CLINIC_OR_DEPARTMENT_OTHER): Payer: Self-pay

## 2024-07-11 DIAGNOSIS — C50919 Malignant neoplasm of unspecified site of unspecified female breast: Secondary | ICD-10-CM

## 2024-07-11 DIAGNOSIS — Z1501 Genetic susceptibility to malignant neoplasm of breast: Secondary | ICD-10-CM

## 2024-07-17 ENCOUNTER — Encounter (HOSPITAL_BASED_OUTPATIENT_CLINIC_OR_DEPARTMENT_OTHER): Payer: Self-pay | Admitting: Certified Nurse Midwife

## 2024-07-20 ENCOUNTER — Ambulatory Visit
Admission: RE | Admit: 2024-07-20 | Discharge: 2024-07-20 | Disposition: A | Discharge status: Home / Self Care | Source: Ambulatory Visit | Attending: Family Medicine | Admitting: Family Medicine

## 2024-07-20 ENCOUNTER — Other Ambulatory Visit: Payer: Self-pay

## 2024-07-20 ENCOUNTER — Other Ambulatory Visit (HOSPITAL_COMMUNITY): Payer: Self-pay

## 2024-07-20 VITALS — BP 94/68 | HR 79 | Temp 97.9°F | Resp 16

## 2024-07-20 DIAGNOSIS — R6889 Other general symptoms and signs: Secondary | ICD-10-CM | POA: Diagnosis not present

## 2024-07-20 DIAGNOSIS — L02211 Cutaneous abscess of abdominal wall: Secondary | ICD-10-CM

## 2024-07-20 DIAGNOSIS — M25551 Pain in right hip: Secondary | ICD-10-CM

## 2024-07-20 MED ORDER — CEFTRIAXONE SODIUM 1 G IJ SOLR
1000.0000 mg | Freq: Once | INTRAMUSCULAR | Status: AC
  Administered 2024-07-20: 1000 mg via INTRAMUSCULAR

## 2024-07-20 MED ORDER — DOXYCYCLINE HYCLATE 100 MG PO CAPS
100.0000 mg | ORAL_CAPSULE | Freq: Two times a day (BID) | ORAL | 0 refills | Status: DC
  Filled 2024-07-20 – 2024-07-21 (×2): qty 14, 7d supply, fill #0

## 2024-07-20 NOTE — ED Provider Notes (Signed)
 [facility]    CSN: [id number] Arrival date & time: 07/20/24  1108      History   Chief Complaint Chief Complaint  Patient presents with   Abscess    HPI Amber Sweeney is a 43 y.o. female.   HPI  Patient has an infection in the skin of her groin.  It is overlying her right hip.  It is drainage purulence that is malodorous.  Has been present for a couple of days.  She has also developed some pain in her hip joint.  Vague malaise.  Pain with certain hip movements.  Is worried that the infection has spread.  Has not noted fever or chills.  Has not had history of skin infections in the past.  No known trauma or hip injury.  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 03/01/2022   Palpitations 11/09/2021   Hereditary hemochromatosis 10/26/2021   Hashimoto's thyroiditis 10/26/2021   Anxiety 10/26/2021   Overweight (BMI 25.0-29.9) 05/10/2021   Migraine with aura, not intractable 02/11/2016   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    Past Surgical History:  Procedure Laterality Date   CHOLECYSTECTOMY     Ovaries and tubes removed Bilateral    age 75   WISDOM TOOTH EXTRACTION      OB History     Gravida  4   Para  3   Term  3   Preterm      AB  1   Living  3      SAB  1   IAB      Ectopic      Multiple      Live Births  3            Home Medications    Prior to Admission medications   Medication Sig Start Date End Date Taking? Authorizing Provider  doxycycline (VIBRAMYCIN) 100 MG capsule Take 1  capsule (100 mg total) by mouth 2 (two) times daily. 07/20/24  Yes Maranda Jamee Jacob, MD  Cholecalciferol  (VITAMIN D ) 1.25 MG (50000 UT) CAPS Take 1 capsule by mouth every 30 (thirty) days. 07/01/24     clonazePAM  (KLONOPIN ) 0.5 MG tablet Take 1-2 tablets (0.5-1 mg total) by mouth 2 (two) times daily for anxiety 04/02/23     metoCLOPramide  (REGLAN ) 10 MG tablet Take 1 tablet (10 mg total) by mouth every 8 (eight) hours as needed for nausea. 10/25/22   O'Sullivan, Melissa, NP  Multiple Vitamin (MULTIVITAMIN) capsule Take 1 capsule by mouth daily.    [provider]  progesterone  (PROMETRIUM ) 200 MG capsule Take 1 capsule (200 mg total) by mouth at bedtime. 07/04/24   Lo, Arland POUR, CNM  propranolol  (INDERAL ) 20 MG tablet Take 1 tablet (20 mg total) by mouth 2 (two) times daily. 07/01/24   Leigh Venetia CROME, MD  rizatriptan  (MAXALT ) 10 MG tablet Take 1 tablet (10 mg total) by mouth as needed for migraine. May repeat in 2 hours if needed 03/28/23   Leigh Venetia CROME, MD  thyroid  (NP THYROID )  15 MG tablet Take 1 tablet (15 mg total) by mouth daily. 07/10/24   Lo, Donna K, CNM  valACYclovir  (VALTREX ) 1000 MG tablet Take 1 tablet by mouth 3 (three) times daily for 7 days 10/30/23   Almarie Birmingham B, NP  Vitamin D , Ergocalciferol , (DRISDOL ) 1.25 MG (50000 UNIT) CAPS capsule Take 1 capsule (50,000 Units total) by mouth every 7 (seven) days. 07/04/24   Tad Arland POUR, CNM    Family History Family History  Problem Relation Age of Onset   Valvular heart disease Mother        Hx valve replacement   BRCA 1/2 Mother    Diabetes Father    Hashimoto's thyroiditis Sister    BRCA 1/2 Sister    Anemia Sister    Diabetes Brother    Breast cancer Maternal Aunt     Social History Social History   Tobacco Use   Smoking status: Former    Current packs/day: 0.00    Types: Cigarettes    Quit date: 10/10/2018    Years since quitting: 5.7    Passive exposure: Past   Smokeless tobacco: Never   Tobacco comments:    Quit  smoking 10/2018  Vaping Use   Vaping status: Every Day   Start date: 10/09/2018  Substance Use Topics   Alcohol use: Not Currently   Drug use: Not Currently     Allergies   Iodinated contrast media, Cephalosporins, Cyclosporine, Sumatriptan, Celecoxib, Gabapentin , Levothyroxine sodium, Liraglutide -weight management, Pregabalin, Topiramate , and Wellbutrin [bupropion]   Review of Systems Review of Systems  See HPI Physical Exam Triage Vital Signs ED Triage Vitals  Encounter Vitals Group     BP 07/20/24 1121 94/68     Girls Systolic BP Percentile --      Girls Diastolic BP Percentile --      Boys Systolic BP Percentile --      Boys Diastolic BP Percentile --      Pulse Rate 07/20/24 1121 79     Resp 07/20/24 1121 16     Temp 07/20/24 1121 97.9 F (36.6 C)     Temp src --      SpO2 07/20/24 1121 97 %     Weight --      Height --      Head Circumference --      Peak Flow --      Pain Score 07/20/24 1126 4     Pain Loc --      Pain Education --      Exclude from Growth Chart --    No data found.  Updated Vital Signs BP 94/68   Pulse 79   Temp 97.9 F (36.6 C)   Resp 16   SpO2 97%      Physical Exam Constitutional:      General: She is not in acute distress.    Appearance: She is well-developed.  HENT:     Head: Normocephalic and atraumatic.  Eyes:     Conjunctiva/sclera: Conjunctivae normal.     Pupils: Pupils are equal, round, and reactive to light.  Cardiovascular:     Rate and Rhythm: Normal rate.  Pulmonary:     Effort: Pulmonary effort is normal. No respiratory distress.  Abdominal:     General: There is no distension.     Palpations: Abdomen is soft.   Musculoskeletal:        General: Normal range of motion.     Cervical back: Normal range of motion.  Comments: Hip has full range of motion  Skin:    General: Skin is warm and dry.  Neurological:     Mental Status: She is alert.     Gait: Gait normal.      UC Treatments / Results   Labs (all labs ordered are listed, but only abnormal results are displayed) Labs Reviewed  CBC WITH DIFFERENTIAL/PLATELET  C-REACTIVE PROTEIN    EKG   Radiology No results found.  Procedures Procedures (including critical care time)  Medications Ordered in UC Medications  cefTRIAXone (ROCEPHIN) injection 1,000 mg (1,000 mg Intramuscular Given 07/20/24 1210)    Initial Impression / Assessment and Plan / UC Course  I have reviewed the triage vital signs and the nursing notes.  Pertinent labs & imaging results that were available during my care of the patient were reviewed by me and considered in my medical decision making (see chart for details).     Because of concern for infection and we will treat her with a shot of Rocephin.  She states as a child she had a rash from an unknown cephalosporin she thinks was Keflex.  She understands that she might get a rash from the Rocephin but but does desire the injection anyway and states she will take Benadryl if she starts to have itching or rash.  She did not have anaphylaxis or serious reaction.  She will take ibuprofen, Tylenol, or Aleve for pain.  I am going to get a CBC and CRP just to make sure she does not have extension into her joint since this is her concern.  Clinically I think it is just a skin infection.  Cannot explain the malaise and hip joint pain  Final Clinical Impressions(s) / UC Diagnoses   Final diagnoses:  Abscess of abdominal wall  Acute right hip pain  Feeling unwell     Discharge Instructions      Use warm compresses to area Take doxycycline 2 times a day with food May take ibuprofen or naproxen for hip pain Check MyChart for your test results.  A nurse will call if any results are abnormal     ED Prescriptions     Medication Sig Dispense Auth. Provider   doxycycline (VIBRAMYCIN) 100 MG capsule Take 1 capsule (100 mg total) by mouth 2 (two) times daily. 14 capsule Maranda Jamee Jacob, MD       PDMP not reviewed this encounter.   Maranda Jamee Jacob, MD 07/20/24 1328

## 2024-07-20 NOTE — Discharge Instructions (Signed)
 Use warm compresses to area Take doxycycline 2 times a day with food May take ibuprofen or naproxen for hip pain Check MyChart for your test results.  A nurse will call if any results are abnormal

## 2024-07-20 NOTE — ED Triage Notes (Signed)
 A week ago right hip started hurting deep inside. Reports hip will give out. Reports has 2 abscesses to that area now reports 1 on labia 2 days ago and 1 on crease of leg 1 day ago. Has been having a headache. Had a low grade fever yesterday. Has been taking tylenol and ibuprofen.

## 2024-07-21 ENCOUNTER — Other Ambulatory Visit (HOSPITAL_COMMUNITY): Payer: Self-pay

## 2024-07-21 ENCOUNTER — Encounter (HOSPITAL_COMMUNITY): Payer: Self-pay

## 2024-07-21 LAB — CBC WITH DIFFERENTIAL/PLATELET
Basophils Absolute: 0.1 x10E3/uL (ref 0.0–0.2)
Basos: 1 %
EOS (ABSOLUTE): 0.2 x10E3/uL (ref 0.0–0.4)
Eos: 2 %
Hematocrit: 44.1 % (ref 34.0–46.6)
Hemoglobin: 14.5 g/dL (ref 11.1–15.9)
Immature Grans (Abs): 0 x10E3/uL (ref 0.0–0.1)
Immature Granulocytes: 0 %
Lymphocytes Absolute: 2.6 x10E3/uL (ref 0.7–3.1)
Lymphs: 33 %
MCH: 31.9 pg (ref 26.6–33.0)
MCHC: 32.9 g/dL (ref 31.5–35.7)
MCV: 97 fL (ref 79–97)
Monocytes Absolute: 0.5 x10E3/uL (ref 0.1–0.9)
Monocytes: 7 %
Neutrophils Absolute: 4.4 x10E3/uL (ref 1.4–7.0)
Neutrophils: 57 %
Platelets: 307 x10E3/uL (ref 150–450)
RBC: 4.55 x10E6/uL (ref 3.77–5.28)
RDW: 11.9 % (ref 11.7–15.4)
WBC: 7.8 x10E3/uL (ref 3.4–10.8)

## 2024-07-21 LAB — C-REACTIVE PROTEIN: CRP: 8 mg/L (ref 0–10)

## 2024-07-24 ENCOUNTER — Encounter: Payer: Self-pay | Admitting: Family Medicine

## 2024-07-29 ENCOUNTER — Other Ambulatory Visit: Payer: Self-pay

## 2024-07-30 ENCOUNTER — Ambulatory Visit

## 2024-07-30 ENCOUNTER — Ambulatory Visit (HOSPITAL_BASED_OUTPATIENT_CLINIC_OR_DEPARTMENT_OTHER)

## 2024-07-31 ENCOUNTER — Encounter: Payer: Self-pay | Admitting: Family

## 2024-08-03 ENCOUNTER — Other Ambulatory Visit

## 2024-08-06 ENCOUNTER — Other Ambulatory Visit: Payer: Self-pay | Admitting: Medical Genetics

## 2024-08-06 DIAGNOSIS — Z006 Encounter for examination for normal comparison and control in clinical research program: Secondary | ICD-10-CM

## 2024-08-11 NOTE — Progress Notes (Deleted)
 NEUROLOGY FOLLOW UP OFFICE NOTE  [name] [id number]  Subjective:  Amber Sweeney is a 43 y.o. year old female with a medical history of hereditary hemochromatosis (homozygous for C282Y mutation), Hashimoto's thyroiditis, depression, anxiety who we last saw on 02/15/24 for migraines.  To briefly review: 03/28/23: Patient has had migraines since puberty. They went away for many years. Over the last year she has started having more headaches. She has 2-3 headaches per month that last 8-12 hours. She describes the pain as feeling like her head is being squeezed, mostly on the left, but also can include the right. She rates these as 6/10. There is associated photophobia and nausea, but no phonophobia. She wants to lay in a dark, quiet room when she gets this headache. The headache does not worsen with laying down. She has had blurry vision once, but otherwise denies vision changes. She denies aura.   She has a background headache 3 times per week. She describes this as pressure over her whole head. She denies neck pain. She rates it as a 2-3/10. There is no photophobia or nausea with this headache. She can work through this headache. There are periods of headache freedom.   She will take tylenol or ibuprofen 3 times per week. This helps with the background headache. It does not help with the more severe headaches. She does wait to see if headache will not go away. She will try to massage her pressure points or use peppermint oil. She will then take benadryl and reglan  and this will help with the more severe headache. She will sleep and the headache is usually gone in 45 minutes.   She has tried Imitrex once in the past (years ago) and made her headaches significantly worse. She did not take it ever again.   Patient also describes skin sensitivity in her arms, back, and head. She had it once on her legs. She feels like the nerves are on the surface of her skin. Any touch really hurts. It  will come on out of the blue and then resolves on its own in a few days. This has happened about 3 times in the last 1-2 years. Patient was given Lyria for this in the past, but she was not able to tolerate it (felt loopy). At one point, someone thought she had shingles and was given valtrex . She is not sure if this did anything.   Of note, patient is on prozac  20 mg for hot flashes (ovaries out) and clonazepam  0.5-1 mg BID PRN for anxiety.   Caffeine: 1-2 glasses of tea per day Smoker: Vapes EtOH use: none Family history of migraines: Mother has migraines Family history of other neurologic disease: daughter with epilepsy  I started Topamax  25 mg and Maxalt  10 mg PRN on 03/28/23.  02/15/24: Patient's B12 was borderline low, so B12 1000 mcg daily was recommended. She is taking a B complex.    Patient had palpitations on Topamax , so the decision was made to stop it. I started propranolol  20 mg BID on 04/26/23 given her history of palpitations. She is still on this.   Her headaches are doing well. She has only maybe had 2 headaches since last visit. She does get frequent auras (flashing lights), but this is not bothersome to her. When she gets a headache, Maxalt  works well. She has only taken this about 3 times.   The paresthesias have also improved, only happening once since last visit.   Patient has palpitations  and near syncope. She has talked to cardiology about it. They do not think patient meets criteria for POTS but not she has intermittent tachycardia, palpitations, and dysautonomia like symptoms. Lifestyle modifications were given.  Most recent Assessment and Plan (02/15/24): This is Amber Sweeney, a 43 y.o. female with migraines, likely with aura. She tried topamax , but was unable to tolerate (caused palpitations). She is doing well with propranolol  20 mg BID with 2-3 headaches since last visit.   Plan: -For migraines: Migraine prevention:  Continue propranolol  20 mg BID Migraine  rescue:  Maxalt  10 mg PRN at headache onset, can repeat after 2 hours  Limit use of pain relievers to no more than 2 days out of week to prevent risk of rebound or medication-overuse headache. Keep headache diary -Orthostatic intolerance recommendations given as patient mentioned POTS like symptoms that is followed by cardiology  Since their last visit: ***  MEDICATIONS:  Outpatient Encounter Medications as of 08/20/2024  Medication Sig   Cholecalciferol  (VITAMIN D ) 1.25 MG (50000 UT) CAPS Take 1 capsule by mouth every 30 (thirty) days.   clonazePAM  (KLONOPIN ) 0.5 MG tablet Take 1-2 tablets (0.5-1 mg total) by mouth 2 (two) times daily for anxiety   doxycycline (VIBRAMYCIN) 100 MG capsule Take 1 capsule (100 mg total) by mouth 2 (two) times daily.   metoCLOPramide  (REGLAN ) 10 MG tablet Take 1 tablet (10 mg total) by mouth every 8 (eight) hours as needed for nausea.   Multiple Vitamin (MULTIVITAMIN) capsule Take 1 capsule by mouth daily.   progesterone  (PROMETRIUM ) 200 MG capsule Take 1 capsule (200 mg total) by mouth at bedtime.   propranolol  (INDERAL ) 20 MG tablet Take 1 tablet (20 mg total) by mouth 2 (two) times daily.   rizatriptan  (MAXALT ) 10 MG tablet Take 1 tablet (10 mg total) by mouth as needed for migraine. May repeat in 2 hours if needed   thyroid  (NP THYROID ) 15 MG tablet Take 1 tablet (15 mg total) by mouth daily.   valACYclovir  (VALTREX ) 1000 MG tablet Take 1 tablet by mouth 3 (three) times daily for 7 days   Vitamin D , Ergocalciferol , (DRISDOL ) 1.25 MG (50000 UNIT) CAPS capsule Take 1 capsule (50,000 Units total) by mouth every 7 (seven) days.   No facility-administered encounter medications on file as of 08/20/2024.    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   Hashimoto's thyroiditis    Hemochromatosis     PAST SURGICAL HISTORY: Past Surgical History:  Procedure Laterality Date   CHOLECYSTECTOMY      Ovaries and tubes removed Bilateral    age 23   WISDOM TOOTH EXTRACTION      ALLERGIES: Allergies  Allergen Reactions   Iodinated Contrast Media Other (See Comments)    Panic Attack   Cephalosporins Rash   Cyclosporine Hives   Sumatriptan Other (See Comments)   Celecoxib Other (See Comments)    Patient stated it gave her pancreatitis   Gabapentin  Palpitations   Levothyroxine Sodium Palpitations   Liraglutide -Weight Management Other (See Comments)    Injection site turned black for months   Pregabalin Diarrhea    Panic attack     Topiramate  Palpitations   Wellbutrin [Bupropion] Other (See Comments)    I didn't care about anything    FAMILY HISTORY: Family History  Problem Relation Age of Onset   Valvular heart disease Mother        Hx valve replacement  BRCA 1/2 Mother    Diabetes Father    Hashimoto's thyroiditis Sister    BRCA 1/2 Sister    Anemia Sister    Diabetes Brother    Breast cancer Maternal Aunt     SOCIAL HISTORY: Social History   Tobacco Use   Smoking status: Former    Current packs/day: 0.00    Types: Cigarettes    Quit date: 10/10/2018    Years since quitting: 5.8    Passive exposure: Past   Smokeless tobacco: Never   Tobacco comments:    Quit smoking 10/2018  Vaping Use   Vaping status: Every Day   Start date: 10/09/2018  Substance Use Topics   Alcohol use: Not Currently   Drug use: Not Currently   Social History   Social History Narrative   Are you right handed or left handed? right   Are you currently employed ?   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   Who lives with you? family   What type of home do you live in: 1 story or 2 story? one    Caffiene. rarely      Objective:  Vital Signs:  There were no vitals taken for this visit.  ***  Labs and Imaging review: New results: 07/20/24: CRP wnl CBC w/ diff unremarkable  HbA1c (05/05/24): 5.9  05/02/24: TSH wnl Vit D low at  28.44 Ferritin 57.6 B12: 453 Folate wnl  Previously reviewed results: 03/28/23: B1 wnl B12: 295       Lab Results  Component Value Date    HGBA1C 4.8 01/29/2023      Recent Labs[] Expand by Default           Lab Results  Component Value Date    TSH 2.09 01/29/2023      Iron studies (01/29/23): wnl (ferritin 50)   CMP (01/29/23): unremarkable CBC (01/29/23): unremarkable   Lipid panel (01/29/23): Component     Latest Ref Rng 01/29/2023  Cholesterol     0 - 200 mg/dL 800   Triglycerides     0.0 - 149.0 mg/dL 873.9   HDL Cholesterol     >39.00 mg/dL 46.49   VLDL     0.0 - 40.0 mg/dL 74.7   LDL (calc)     0 - 99 mg/dL 879 (H)   Total CHOL/HDL Ratio 4   NonHDL 145.12      Assessment/Plan:  This is Amber Sweeney, a 43 y.o. female with: ***   Plan: ***  Return to clinic in ***  Total time spent reviewing records, interview, history/exam, documentation, and coordination of care on day of encounter:  *** min  Venetia Potters, MD

## 2024-08-20 ENCOUNTER — Encounter: Payer: Self-pay | Admitting: Neurology

## 2024-08-20 ENCOUNTER — Ambulatory Visit: Admitting: Neurology

## 2024-08-31 ENCOUNTER — Other Ambulatory Visit

## 2024-09-03 LAB — GENECONNECT MOLECULAR SCREEN: Genetic Analysis Overall Interpretation: POSITIVE — AB

## 2024-09-08 ENCOUNTER — Telehealth: Payer: Self-pay | Admitting: Medical Genetics

## 2024-09-08 DIAGNOSIS — Z15068 Genetic susceptibility to other malignant neoplasm of digestive system: Secondary | ICD-10-CM

## 2024-09-12 NOTE — Telephone Encounter (Signed)
 [facility] Positive Result Note 09/12/2024 10:57 AM  SECOND ATTEMPT: Confirmed I was speaking with Amber Sweeney [id number] by using name and DOB. Informed participant the reason for this call is to provide results for the above study. Results revealed Hereditary Breast and Ovarian Syndrome. Genetic counseling was offered and participant declined. All questions were answered, and participant was thanked for their time and support of the above study. Participant was encouraged to contact [facility] if they have any further questions or concerns.

## 2024-09-13 ENCOUNTER — Ambulatory Visit
Admission: RE | Admit: 2024-09-13 | Discharge: 2024-09-13 | Disposition: A | Discharge status: Home / Self Care | Source: Ambulatory Visit

## 2024-09-13 VITALS — BP 109/76 | HR 86 | Temp 97.3°F | Resp 18

## 2024-09-13 DIAGNOSIS — L739 Follicular disorder, unspecified: Secondary | ICD-10-CM | POA: Diagnosis not present

## 2024-09-13 DIAGNOSIS — L03116 Cellulitis of left lower limb: Secondary | ICD-10-CM

## 2024-09-13 MED ORDER — SULFAMETHOXAZOLE-TRIMETHOPRIM 800-160 MG PO TABS: 1.0000 | ORAL_TABLET | Freq: Two times a day (BID) | ORAL | 0 refills | Status: DC

## 2024-09-13 MED ORDER — MUPIROCIN 2 % EX OINT: TOPICAL_OINTMENT | CUTANEOUS | 0 refills | Status: DC

## 2024-09-13 NOTE — Discharge Instructions (Addendum)
 Please begin Bactrim  1 tablet twice daily for the next 5 days.  You are also welcome to apply topical mupirocin  twice daily to not only treat for infection but will also reduce friction in that area.  Tea tree oil will also work well.  If you have not had meaningful improvement after 5 days antibiotics, please return for repeat evaluation.  Thank you for visiting [facility] today.

## 2024-09-13 NOTE — ED Provider Notes (Signed)
 [facility]    CSN: [id number] Arrival date & time: 09/13/24  1215    HISTORY   Chief Complaint  Patient presents with   Abscess    Entered by patient   HPI Amber Sweeney is a pleasant, 43 y.o. female who presents to urgent care today. Patient states that 2 days ago, she noticed a small bump concerning for an abscess on her left inner thigh at her left groin.  Patient states it was painful initially so she applied tea tree oil to the area.  Patient states this seemed to improve the lesion and she thought it went away.  Patient states that last night, the area began to itch and she scratched it.  Patient states she believes she may have scratched it too hard because after scratching, she noticed that it was weeping and now the area is painful.  The history is provided by the patient.  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   Hashimoto's thyroiditis    Hemochromatosis    Patient Active Problem List   Diagnosis Date Noted   BRCA gene mutation positive 07/04/2024   Familial cancer of breast (HCC) 07/04/2024   Bulky or enlarged uterus 07/04/2024   Postmenopausal 07/04/2024   BMI 36.0-36.9,adult 07/04/2024   Vitamin D  deficiency 10/30/2023   ADD (attention deficit disorder) 03/01/2022   BRCA gene mutation negative in female 03/01/2022   Palpitations 11/09/2021   Hereditary hemochromatosis 10/26/2021   Hashimoto's thyroiditis 10/26/2021   Anxiety 10/26/2021   Overweight (BMI 25.0-29.9) 05/10/2021   Migraine with aura, not intractable 02/11/2016   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 Authorizing Provider  Cholecalciferol  (VITAMIN D ) 1.25 MG (50000 UT) CAPS Take 1 capsule by mouth every 30 (thirty) days. 07/01/24  Yes   clonazePAM  (KLONOPIN ) 0.5 MG tablet Take 1-2 tablets (0.5-1 mg total) by mouth 2 (two) times daily for anxiety 04/02/23  Yes   metoCLOPramide  (REGLAN ) 10 MG tablet Take 1 tablet (10 mg total) by mouth every 8 (eight) hours as needed for nausea. 10/25/22  Yes Daryl Setter, NP  mupirocin  ointment (BACTROBAN ) 2 % Apply to affected area twice daily for 10 days. 09/13/24  Yes Joesph Shaver Scales, PA-C  propranolol  (INDERAL ) 20 MG tablet Take 1 tablet (20 mg total) by mouth 2 (two) times daily. 07/01/24  Yes Leigh Venetia CROME, MD  rizatriptan  (MAXALT ) 10 MG tablet Take 1 tablet (10 mg total) by mouth as needed for migraine. May repeat in 2 hours if needed 03/28/23  Yes Hill, Venetia CROME, MD  sulfamethoxazole -trimethoprim  (BACTRIM  DS) 800-160 MG tablet Take 1 tablet by mouth 2 (two) times daily for 5 days. 09/13/24 09/18/24 Yes Joesph Shaver Scales, PA-C  UNABLE  TO FIND Med Name: Compounded GLP1: 4 mg Semaglutide , NOA, and Bitamin B12   Yes [provider]  valACYclovir  (VALTREX ) 1000 MG tablet Take 1 tablet by mouth 3 (three) times daily for 7 days Patient taking differently: Take 1,000 mg by mouth daily as needed. 10/30/23  Yes Almarie Waddell NOVAK, NP  Vitamin D , Ergocalciferol , (DRISDOL ) 1.25 MG (50000 UNIT) CAPS capsule Take 1 capsule (50,000 Units total) by mouth every 7 (seven) days. 07/04/24   Lo, Arland POUR, CNM    Family History Family History  Problem Relation Age of Onset   Valvular heart disease Mother        Hx valve replacement   BRCA 1/2 Mother    Diabetes Father    Hashimoto's thyroiditis Sister    BRCA 1/2 Sister    Anemia Sister    Diabetes Brother    Breast cancer Maternal Aunt    Social History Social History   Tobacco Use   Smoking status: Former     Current packs/day: 0.00    Types: Cigarettes    Quit date: 10/10/2018    Years since quitting: 5.9    Passive exposure: Past   Smokeless tobacco: Never   Tobacco comments:    Quit smoking 10/2018  Vaping Use   Vaping status: Every Day   Start date: 10/09/2018  Substance Use Topics   Alcohol use: Not Currently   Drug use: Not Currently   Allergies   Iodinated contrast media, Cephalosporins, Cyclosporine, Sumatriptan, Celecoxib, Gabapentin , Levothyroxine sodium, Liraglutide -weight management, Pregabalin, Topiramate , and Wellbutrin [bupropion]  Review of Systems Review of Systems Pertinent findings revealed after performing a 14 point review of systems has been noted in the history of present illness.  Physical Exam Vital Signs BP 109/76 (BP Location: Right Arm)   Pulse 86   Temp (!) 97.3 F (36.3 C) (Oral)   Resp 18   SpO2 95%   No data found.  Physical Exam Vitals and nursing note reviewed.  Constitutional:      General: She is awake. She is not in acute distress.    Appearance: Normal appearance. She is well-developed and well-groomed. She is obese. She is not ill-appearing.  Musculoskeletal:       Legs:  Neurological:     Mental Status: She is alert.  Psychiatric:        Behavior: Behavior is cooperative.     Visual Acuity Right Eye Distance:   Left Eye Distance:   Bilateral Distance:    Right Eye Near:   Left Eye Near:    Bilateral Near:     UC Couse / Diagnostics / Procedures:     Radiology No results found.  Procedures Procedures (including critical care time) EKG  Pending results:  Labs Reviewed - No data to display  Medications Ordered in UC: Medications - No data to display  UC Diagnoses / Final Clinical Impressions(s)   I have reviewed the triage vital signs and the nursing notes.  Pertinent labs & imaging results that were available during my care of the patient were reviewed by me and considered in my medical decision making (see  chart for details).    Final diagnoses:  Folliculitis  Cellulitis of left lower extremity   Will treat patient empirically for presumed cellulitis secondary to abscess with a 5-day course of Bactrim .  Topical mupirocin  also recommended twice daily.  Conservative care recommended.  Return precautions advised.  Please see discharge instructions below for details of plan of care as  provided to patient. ED Prescriptions     Medication Sig Dispense Auth. Provider   sulfamethoxazole -trimethoprim  (BACTRIM  DS) 800-160 MG tablet Take 1 tablet by mouth 2 (two) times daily for 5 days. 10 tablet Joesph Shaver Scales, PA-C   mupirocin  ointment (BACTROBAN ) 2 % Apply to affected area twice daily for 10 days. 30 g Joesph Shaver Scales, PA-C      PDMP not reviewed this encounter.    Discharge Instructions      Please begin Bactrim  1 tablet twice daily for the next 5 days.  You are also welcome to apply topical mupirocin  twice daily to not only treat for infection but will also reduce friction in that area.  Tea tree oil will also work well.  If you have not had meaningful improvement after 5 days antibiotics, please return for repeat evaluation.  Thank you for visiting [facility] today.    Disposition Upon Discharge:  Condition: stable for discharge home  Patient presented with an acute illness with associated systemic symptoms and significant discomfort requiring urgent management. In my opinion, this is a condition that a prudent lay person (someone who possesses an average knowledge of health and medicine) may potentially expect to result in complications if not addressed urgently such as respiratory distress, impairment of bodily function or dysfunction of bodily organs.   Routine symptom specific, illness specific and/or disease specific instructions were discussed with the patient and/or caregiver at length.   As such, the patient has been evaluated and assessed, work-up  was performed and treatment was provided in alignment with urgent care protocols and evidence based medicine.  Patient/parent/caregiver has been advised that the patient may require follow up for further testing and treatment if the symptoms continue in spite of treatment, as clinically indicated and appropriate.  Patient/parent/caregiver has been advised to return to the [facility] or PCP if no better; to PCP or the Emergency Department if new signs and symptoms develop, or if the current signs or symptoms continue to change or worsen for further workup, evaluation and treatment as clinically indicated and appropriate  The patient will follow up with their current PCP if and as advised. If the patient does not currently have a PCP we will assist them in obtaining one.   The patient may need specialty follow up if the symptoms continue, in spite of conservative treatment and management, for further workup, evaluation, consultation and treatment as clinically indicated and appropriate.  Patient/parent/caregiver verbalized understanding and agreement of plan as discussed.  All questions were addressed during visit.  Please see discharge instructions below for further details of plan.  This office note has been dictated using Teaching laboratory technician.  Unfortunately, this method of dictation can sometimes lead to typographical or grammatical errors.  I apologize for your inconvenience in advance if this occurs.  Please do not hesitate to reach out to me if clarification is needed.      Joesph Shaver Scales, PA-C 09/13/24 1255

## 2024-09-13 NOTE — ED Triage Notes (Signed)
 For two days she had an abscess on her left inner thigh/groin leg. She has had pain, itching, drainage, and irritation.    She has tried tea tree oil and bacitracin which gave some relief.

## 2024-09-16 ENCOUNTER — Other Ambulatory Visit: Payer: Self-pay

## 2024-09-16 ENCOUNTER — Ambulatory Visit
Admission: RE | Admit: 2024-09-16 | Discharge: 2024-09-16 | Disposition: A | Discharge status: Home / Self Care | Attending: Internal Medicine

## 2024-09-16 ENCOUNTER — Other Ambulatory Visit (HOSPITAL_COMMUNITY): Payer: Self-pay

## 2024-09-16 VITALS — BP 109/75 | HR 96 | Temp 98.5°F | Resp 16

## 2024-09-16 DIAGNOSIS — L304 Erythema intertrigo: Secondary | ICD-10-CM | POA: Diagnosis not present

## 2024-09-16 DIAGNOSIS — L03116 Cellulitis of left lower limb: Secondary | ICD-10-CM

## 2024-09-16 MED ORDER — KETOCONAZOLE 2 % EX CREA
1.0000 | TOPICAL_CREAM | Freq: Every day | CUTANEOUS | 1 refills | Status: DC
  Filled 2024-09-16: qty 15, 30d supply, fill #0

## 2024-09-16 MED ORDER — DOXYCYCLINE HYCLATE 100 MG PO CAPS
100.0000 mg | ORAL_CAPSULE | Freq: Two times a day (BID) | ORAL | 0 refills | Status: DC
  Filled 2024-09-16: qty 14, 7d supply, fill #0

## 2024-09-16 MED ORDER — DOXYCYCLINE HYCLATE 100 MG PO CAPS: 100.0000 mg | ORAL_CAPSULE | Freq: Two times a day (BID) | ORAL | 0 refills | Status: AC

## 2024-09-16 MED ORDER — KETOCONAZOLE 2 % EX CREA: 1.0000 | TOPICAL_CREAM | Freq: Every day | CUTANEOUS | 1 refills | Status: AC

## 2024-09-16 MED ORDER — HYDROXYZINE HCL 25 MG PO TABS
25.0000 mg | ORAL_TABLET | Freq: Three times a day (TID) | ORAL | 0 refills | Status: DC | PRN
  Filled 2024-09-16: qty 15, 5d supply, fill #0

## 2024-09-16 MED ORDER — HYDROXYZINE HCL 25 MG PO TABS: 25.0000 mg | ORAL_TABLET | Freq: Three times a day (TID) | ORAL | 0 refills | Status: DC | PRN

## 2024-09-16 NOTE — ED Triage Notes (Signed)
 Patient states she a red, itchy, swollen area to left groin area. Patient states she has been taking bactrim  for several days but the area is now worse after scratching it.

## 2024-09-16 NOTE — Discharge Instructions (Addendum)
 I have sent an antibiotic (Doxycycline ) for you to take by mouth for your skin as well as an ointment called Ketoconazole  for intertrigo. These are often used to treat skin infections. Take them as directed.   I have also sent a prescription called Atarax  to help with the itching. Avoid with the Klonopin , Reglan , and Maxalt .  Return in 3-4 days if no improvement. It is very important for you to pay attention to any new symptoms or worsening of your current condition. Please go directly to the Emergency Department immediately should you begin to feel worse in any way or have any of the following symptoms: persistent fevers, increased pain, increased swelling, increased redness, redness outside the demarcation or streaking up your leg.

## 2024-09-16 NOTE — ED Provider Notes (Signed)
 [facility]  Note:  This document was prepared using Dragon voice recognition software and may include unintentional dictation errors.  MRN: [id number] DOB: [birth year] DATE: 09/16/24   Subjective:  Chief Complaint:  Chief Complaint  Patient presents with   Allergic Reaction    Entered by patient   Rash     HPI: Amber Sweeney is a 43 y.o. female presenting for rash for the past 5-6 days. Patient states that Tuesday of last week she noticed a bump in her left groin area while showering. She states she would get them in the past in her axilla and they resolved with tea tree oil. She applied the tea oil to the lesion. She states she must have been scratching while sleeping because she woke up on Friday to her groin red and weeping. She was seen at this office on Saturday. She was diagnosed with this and cellulitis of her left lower extremity.  She started on Bactrim  and prescribed mupirocin .  She states she has been taking both as directed and has seen no improvement.  She feels that the rash is getting worse.  She states that it is spread and is very pruritic.  She states she is constantly rubbing and scratching at the rash.  She states the bump has improved, but everything else has worsened.  She reports no inciting cause prior to the onset of the rash.  She states she had not changed her lotions, detergents, or body washes. Denies fever, vomiting, discharge. Endorses rash, pruritus, skin lesion. Presents NAD.  Prior to Admission medications   Medication Sig Start Date End Date Taking? Authorizing Provider  Cholecalciferol  (VITAMIN D ) 1.25 MG (50000 UT) CAPS Take 1 capsule by mouth every 30 (thirty) days. 07/01/24     clonazePAM  (KLONOPIN ) 0.5 MG tablet Take 1-2 tablets (0.5-1 mg total) by mouth 2 (two) times daily for anxiety 04/02/23     metoCLOPramide  (REGLAN ) 10 MG tablet Take 1 tablet (10 mg total) by mouth every 8 (eight) hours as needed for nausea. 10/25/22   O'Sullivan,  Melissa, NP  propranolol  (INDERAL ) 20 MG tablet Take 1 tablet (20 mg total) by mouth 2 (two) times daily. 07/01/24   Leigh Venetia CROME, MD  rizatriptan  (MAXALT ) 10 MG tablet Take 1 tablet (10 mg total) by mouth as needed for migraine. May repeat in 2 hours if needed 03/28/23   Leigh Venetia CROME, MD  UNABLE TO FIND Med Name: Compounded GLP1: 4 mg Semaglutide , NOA, and Bitamin B12    [provider]  valACYclovir  (VALTREX ) 1000 MG tablet Take 1 tablet by mouth 3 (three) times daily for 7 days Patient taking differently: Take 1,000 mg by mouth daily as needed. 10/30/23   Almarie Waddell NOVAK, NP  Vitamin D , Ergocalciferol , (DRISDOL ) 1.25 MG (50000 UNIT) CAPS capsule Take 1 capsule (50,000 Units total) by mouth every 7 (seven) days. 07/04/24   Lo, Arland POUR, CNM     Allergies  Allergen Reactions   Iodinated Contrast Media Other (See Comments)    Panic Attack   Cephalosporins Rash    Had Rocephin  without reaction recenlty but had rash as a child    Cyclosporine Hives   Sumatriptan Other (See Comments)    Made migraines worse   Celecoxib Other (See Comments)    Patient stated it gave her pancreatitis   Gabapentin  Palpitations   Levothyroxine Sodium Palpitations   Liraglutide -Weight Management Other (See Comments)    Injection site turned black for months   Pregabalin  Diarrhea    Panic attack     Topiramate  Palpitations   Wellbutrin [Bupropion] Other (See Comments)    I didn't care about anything    History:   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   Hashimoto's thyroiditis    Hemochromatosis      Past Surgical History:  Procedure Laterality Date   CHOLECYSTECTOMY     Ovaries and tubes removed Bilateral    age 38   WISDOM TOOTH EXTRACTION      Family History  Problem Relation Age of Onset   Valvular heart disease Mother        Hx valve replacement   BRCA 1/2 Mother    Diabetes Father    Hashimoto's thyroiditis Sister    BRCA 1/2  Sister    Anemia Sister    Diabetes Brother    Breast cancer Maternal Aunt     Social History   Tobacco Use   Smoking status: Former    Current packs/day: 0.00    Types: Cigarettes    Quit date: 10/10/2018    Years since quitting: 5.9    Passive exposure: Past   Smokeless tobacco: Never   Tobacco comments:    Quit smoking 10/2018  Vaping Use   Vaping status: Every Day   Start date: 10/09/2018  Substance Use Topics   Alcohol use: Not Currently   Drug use: Not Currently    Review of Systems  Constitutional:  Negative for fever.  Gastrointestinal:  Positive for nausea. Negative for abdominal pain and vomiting.  Skin:  Positive for rash.  Neurological:  Positive for headaches.     Objective:   Vitals: BP 109/75 (BP Location: Right Arm)   Pulse 96   Temp 98.5 F (36.9 C) (Oral)   Resp 16   SpO2 96%   Physical Exam Constitutional:      General: She is not in acute distress.    Appearance: Normal appearance. She is well-developed. She is obese. She is not ill-appearing or toxic-appearing.  HENT:     Head: Normocephalic and atraumatic.  Cardiovascular:     Rate and Rhythm: Normal rate and regular rhythm.     Heart sounds: Normal heart sounds.  Pulmonary:     Effort: Pulmonary effort is normal.     Breath sounds: Normal breath sounds.     Comments: Clear to auscultation bilaterally  Abdominal:     General: Bowel sounds are normal.     Palpations: Abdomen is soft.     Tenderness: There is no abdominal tenderness.  Skin:    General: Skin is warm and dry.     Findings: Abscess and rash present. Rash is macular.     Comments: Patient has pink to erythematous rash overlying left upper groin/thigh, overlying her mons pubis, under her abdomen, and extending to her right groin/thigh.  She is noted to have a white flaking along her bilateral groin.  No discharge noted on exam.  Patient actively scratching.  Patient did show me where previous lesion was first noted.  Appears  to be well-healing abscess.  Nontender to palpation.  No warmth or discharge.  Nonfluctuant.  Neurological:     General: No focal deficit present.     Mental Status: She is alert.  Psychiatric:        Mood and Affect: Mood and affect normal.     Results:  Labs: No results found for this or any previous visit (  from the past 24 hours).  Radiology: No results found.   UC Course/Treatments:  Procedures: Procedures   Medications Ordered in UC: Medications - No data to display   Assessment and Plan :     ICD-10-CM   1. Cellulitis of left lower extremity  L03.116     2. Intertrigo  L30.4       Cellulitis of left lower extremity Intertrigo Afebrile, nontoxic-appearing, NAD. VSS. DDX includes but not limited to: Cellulitis, abscess, intertrigo, folliculitis, candidiasis, tinea cruris Given no improvement, patient was instructed to stop the mupirocin  and Bactrim .  Based on exam as well as pruritus, suspect intertrigo versus tinea cruris.  Ketoconazole  2% cream daily was prescribed.  Given initial lesion was suspected abscess and weeping, will cover for secondary bacterial infection with doxycycline  100 mg twice daily.  She is also prescribed Atarax  25 mg every 8 hours as needed for pruritus.  Recommend follow-up with PCP if no improvement. Strict ED precautions were given and patient verbalized understanding.   ED Discharge Orders          Ordered    doxycycline  (VIBRAMYCIN ) 100 MG capsule  2 times daily        09/16/24 1645    ketoconazole  (NIZORAL ) 2 % cream  Daily        09/16/24 1645    hydrOXYzine  (ATARAX ) 25 MG tablet  Every 8 hours PRN        09/16/24 1646             PDMP not reviewed this encounter.     Basilia Ulanda SQUIBB, PA-C 09/16/24 1657

## 2024-09-18 ENCOUNTER — Encounter: Payer: Self-pay | Admitting: Family Medicine

## 2024-09-18 ENCOUNTER — Ambulatory Visit: Admitting: Family Medicine

## 2024-09-18 VITALS — BP 122/78 | HR 85 | Ht 68.0 in | Wt 241.0 lb

## 2024-09-18 DIAGNOSIS — Z6836 Body mass index (BMI) 36.0-36.9, adult: Secondary | ICD-10-CM

## 2024-09-18 DIAGNOSIS — E559 Vitamin D deficiency, unspecified: Secondary | ICD-10-CM

## 2024-09-18 DIAGNOSIS — R7303 Prediabetes: Secondary | ICD-10-CM

## 2024-09-18 DIAGNOSIS — G43109 Migraine with aura, not intractable, without status migrainosus: Secondary | ICD-10-CM

## 2024-09-18 DIAGNOSIS — E66812 Obesity, class 2: Secondary | ICD-10-CM | POA: Diagnosis not present

## 2024-09-18 DIAGNOSIS — G901 Familial dysautonomia [Riley-Day]: Secondary | ICD-10-CM | POA: Diagnosis not present

## 2024-09-18 DIAGNOSIS — B372 Candidiasis of skin and nail: Secondary | ICD-10-CM

## 2024-09-18 DIAGNOSIS — Z1501 Genetic susceptibility to malignant neoplasm of breast: Secondary | ICD-10-CM

## 2024-09-18 DIAGNOSIS — F900 Attention-deficit hyperactivity disorder, predominantly inattentive type: Secondary | ICD-10-CM

## 2024-09-18 DIAGNOSIS — Z720 Tobacco use: Secondary | ICD-10-CM | POA: Diagnosis not present

## 2024-09-18 DIAGNOSIS — E063 Autoimmune thyroiditis: Secondary | ICD-10-CM | POA: Diagnosis not present

## 2024-09-18 DIAGNOSIS — R739 Hyperglycemia, unspecified: Secondary | ICD-10-CM

## 2024-09-18 DIAGNOSIS — Z1589 Genetic susceptibility to other disease: Secondary | ICD-10-CM

## 2024-09-18 LAB — POCT GLYCOSYLATED HEMOGLOBIN (HGB A1C): Hemoglobin A1C: 5.9 % — AB (ref 4.0–5.6)

## 2024-09-21 ENCOUNTER — Other Ambulatory Visit: Payer: Self-pay | Admitting: Neurology

## 2024-09-21 ENCOUNTER — Other Ambulatory Visit (HOSPITAL_COMMUNITY): Payer: Self-pay

## 2024-09-21 DIAGNOSIS — G43009 Migraine without aura, not intractable, without status migrainosus: Secondary | ICD-10-CM

## 2024-09-22 ENCOUNTER — Other Ambulatory Visit: Payer: Self-pay

## 2024-09-22 ENCOUNTER — Other Ambulatory Visit (HOSPITAL_COMMUNITY): Payer: Self-pay

## 2024-09-22 MED ORDER — PROPRANOLOL HCL 20 MG PO TABS
20.0000 mg | ORAL_TABLET | Freq: Two times a day (BID) | ORAL | 1 refills | Status: AC
  Filled 2024-09-22: qty 60, 30d supply, fill #0
  Filled 2024-10-28: qty 60, 30d supply, fill #1

## 2024-09-26 ENCOUNTER — Telehealth: Admitting: Physician Assistant

## 2024-09-26 DIAGNOSIS — R21 Rash and other nonspecific skin eruption: Secondary | ICD-10-CM

## 2024-09-26 NOTE — Progress Notes (Signed)
" °  Because of the rash with fever, I feel your condition warrants further evaluation and I recommend that you be seen in a face-to-face visit. I would recommend to be seen for testing. It is possible this could be mononucleosis from Ebstein-Barr Virus (Mono), Strep throat with Scarlet Fever, rash with Covid, or a Viral Exanthem (benign rash caused by a virus). Being that you, your daughter, and granddaughter all have something similar, it is less likely to be an allergic reaction.    NOTE: There will be NO CHARGE for this E-Visit   If you are having a true medical emergency, please call 911.     For an urgent face to face visit, [facility] has multiple urgent care centers for your convenience.  Click the link below for the full list of locations and hours, walk-in wait times, appointment scheduling options and driving directions:  Urgent Care - [place], [place], [place], [place], [place], [place]  [place]     Your MyChart E-visit questionnaire answers were reviewed by a board certified advanced clinical practitioner to complete your personal care plan based on your specific symptoms.    Thank you for using e-Visits.    "

## 2024-09-27 ENCOUNTER — Encounter: Payer: Self-pay | Admitting: Family Medicine

## 2024-09-27 ENCOUNTER — Ambulatory Visit
Admission: RE | Admit: 2024-09-27 | Discharge: 2024-09-27 | Disposition: A | Discharge status: Home / Self Care | Source: Ambulatory Visit | Attending: Family Medicine | Admitting: Family Medicine

## 2024-09-27 VITALS — BP 110/79 | HR 75 | Temp 98.1°F | Resp 16

## 2024-09-27 DIAGNOSIS — B372 Candidiasis of skin and nail: Secondary | ICD-10-CM | POA: Insufficient documentation

## 2024-09-27 DIAGNOSIS — G901 Familial dysautonomia [Riley-Day]: Secondary | ICD-10-CM | POA: Insufficient documentation

## 2024-09-27 DIAGNOSIS — B09 Unspecified viral infection characterized by skin and mucous membrane lesions: Secondary | ICD-10-CM | POA: Diagnosis not present

## 2024-09-27 DIAGNOSIS — Z6836 Body mass index (BMI) 36.0-36.9, adult: Secondary | ICD-10-CM | POA: Insufficient documentation

## 2024-09-27 DIAGNOSIS — R7303 Prediabetes: Secondary | ICD-10-CM | POA: Insufficient documentation

## 2024-09-27 MED ORDER — PREDNISONE 20 MG PO TABS: ORAL_TABLET | ORAL | 0 refills | Status: DC

## 2024-09-27 MED ORDER — FLUCONAZOLE 150 MG PO TABS
150.0000 mg | ORAL_TABLET | ORAL | 0 refills | Status: DC
  Filled 2024-09-27: qty 2, 14d supply, fill #0

## 2024-09-27 NOTE — ED Triage Notes (Signed)
 Patient states between 5-7 days ago she started having a rash on her chest which has spread to her abdomen, neck, and arms. The rash is small red, raised areas and has been itching. She did have a fever for about a day that did get up to 101. She has also had fatigue and nasal/chest congestion.   She has tried Benadryl, Cutoconozol, Hydroxyzine , Anti Itch Lotion with some relief. Her granddaughter also has a similar rash.

## 2024-09-27 NOTE — ED Provider Notes (Signed)
 Amber Sweeney    CSN: [id number] Arrival date & time: 09/27/24  9043      History   Chief Complaint Chief Complaint  Patient presents with   Rash    Entered by patient    HPI Amber Sweeney is a 43 y.o. female.   Patient reports that about five days ago she developed mild URI symptoms including fatigue, nasal/chest congestion, and low grade fever for one day that reached 101.  At approximately the same time she began developing a faint erythematous pruritic rash on her chest that spread to her abdomen, neck, and arms.  Her URI symptoms have essentially resolved but her rash persists. Her rash/itching have not improved with Benadryl and a variety of topical preparations.  She notes that her young granddaughter had similar URI symptoms and rash.  The history is provided by the patient.    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 03/01/2022   Palpitations 11/09/2021   Hereditary hemochromatosis 10/26/2021   Hashimoto's thyroiditis 10/26/2021   Anxiety 10/26/2021   Overweight (BMI 25.0-29.9) 05/10/2021   Migraine with aura, not intractable 02/11/2016   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 Authorizing Provider  predniSONE  (DELTASONE ) 20 MG tablet Take one tab by mouth twice daily for 4 days, then one daily for 3 days. Take with food. 09/27/24  Yes Pauline Garnette LABOR, MD  clonazePAM  (KLONOPIN ) 0.5 MG tablet Take 1-2 tablets (0.5-1 mg total) by mouth 2 (two) times daily for anxiety 04/02/23     ketoconazole  (NIZORAL ) 2 % cream Apply 1 Application topically daily for 14 days. 09/16/24 09/30/24  Hermanns, Ashlee P, PA-C  metoCLOPramide  (REGLAN ) 10 MG tablet Take 1 tablet (10 mg total) by mouth every 8 (eight) hours as needed for nausea. 10/25/22   O'Sullivan, Melissa, NP  propranolol  (INDERAL ) 20 MG tablet Take 1 tablet (20 mg total) by mouth 2 (two) times daily. 09/22/24   Leigh Venetia CROME, MD  rizatriptan  (MAXALT ) 10 MG tablet Take 1 tablet (10 mg total) by mouth as needed for migraine. May repeat in 2 hours if needed 03/28/23   Leigh Venetia CROME, MD  valACYclovir  (VALTREX ) 1000 MG tablet Take 1 tablet by mouth 3 (three) times daily for 7 days 10/30/23   Almarie Birmingham  B, NP  Vitamin D , Ergocalciferol , (DRISDOL ) 1.25 MG (50000 UNIT) CAPS capsule Take 1 capsule (50,000 Units total) by mouth every 7 (seven) days. 07/04/24   Lo, Arland POUR, CNM    Family History Family History  Problem Relation Age of Onset   Valvular heart disease Mother        Hx valve replacement   BRCA 1/2 Mother    Miscarriages / Stillbirths Mother    Diabetes Father    Hashimoto's thyroiditis Sister    BRCA 1/2 Sister    Anemia Sister    Diabetes Brother    Breast cancer Maternal Aunt    Cancer Maternal Grandfather    Alcohol abuse Paternal Grandmother    ADD / ADHD Son    ADD / ADHD Sister    Obesity Sister    Anxiety disorder Daughter    Anxiety disorder Daughter    Cancer Maternal Aunt    Diabetes Brother    Hypertension Brother    Obesity Brother    Obesity Sister     Social History Social History[1]   Allergies    Iodinated contrast media, Cephalosporins, Cyclosporine, Sumatriptan, Celecoxib, Gabapentin , Levothyroxine sodium, Liraglutide -weight management, Pregabalin, Topiramate , and Wellbutrin [bupropion]   Review of Systems Review of Systems + sore throat, resolved + cough, resolved No pleuritic pain No wheezing + nasal congestion ? post-nasal drainage No sinus pain/pressure No itchy/red eyes No earache No hemoptysis No SOB + Fever No nausea No vomiting No abdominal pain No diarrhea No urinary symptoms No skin rash + fatigue + myalgias No headache Used OTC meds (Benadryl and topical agents) without relief   Physical Exam Triage Vital Signs ED Triage Vitals  Encounter Vitals Group     BP 09/27/24 1023 110/79     Girls Systolic BP Percentile --      Girls Diastolic BP Percentile --      Boys Systolic BP Percentile --      Boys Diastolic BP Percentile --      Pulse Rate 09/27/24 1023 75     Resp 09/27/24 1023 16     Temp 09/27/24 1023 98.1 F (36.7 C)     Temp Source 09/27/24 1023 Oral     SpO2 09/27/24 1023 97 %     Weight --      Height --      Head Circumference --      Peak Flow --      Pain Score 09/27/24 1022 0     Pain Loc --      Pain Education --      Exclude from Growth Chart --    No data found.  Updated Vital Signs BP 110/79 (BP Location: Right Arm)   Pulse 75   Temp 98.1 F (36.7 C) (Oral)   Resp 16   SpO2 97%   Visual Acuity Right Eye Distance:   Left Eye Distance:   Bilateral Distance:    Right Eye Near:   Left Eye Near:    Bilateral Near:     Physical Exam Skin:        Comments: Chest, abdomen, and back have scattered punctate mildly erythematous macules present.   Nursing notes and Vital Signs reviewed. Appearance:  Patient appears stated age, and in no acute distress Eyes:  Pupils are equal, round, and reactive to light and accomodation.  Extraocular movement is intact.  Conjunctivae are not inflamed  Nose:  Mildly congested  turbinates.  Pharynx:  Normal Neck:  Supple.  No adenopathy.  Lungs:  Clear to auscultation.  Breath sounds are equal.  Moving air well. Heart:  Regular rate and rhythm without murmurs, rubs, or gallops.  Abdomen:  Nontender without masses or hepatosplenomegaly.  Bowel sounds are present. Extremities:  No edema.  Skin:  Rash present (see diagram above)   Sweeney Treatments / Results  Labs (all labs ordered are listed, but only abnormal results are displayed) Labs Reviewed - No data to display  EKG   Radiology No results found.  Procedures Procedures (including critical care time)  Medications Ordered in Sweeney Medications - No data to display  Initial Impression / Assessment and Plan / Sweeney Course  I have reviewed the triage vital signs and the nursing notes.  Pertinent labs & imaging results that were available during my care of the patient were reviewed by me and considered in my medical decision making (see chart for details).    Reassurance. Begin prednisone  burst/taper. Followup with Family Doctor if not improved in about 6 days.  Final Clinical Impressions(s) / Sweeney Diagnoses   Final diagnoses:  Viral exanthem   Discharge Instructions   None    ED Prescriptions     Medication Sig Dispense Auth. Provider   predniSONE  (DELTASONE ) 20 MG tablet Take one tab by mouth twice daily for 4 days, then one daily for 3 days. Take with food. 11 tablet Pauline Garnette LABOR, MD           [1]  Social History Tobacco Use   Smoking status: Former    Current packs/day: 0.00    Types: Cigarettes    Quit date: 10/10/2018    Years since quitting: 5.9    Passive exposure: Past   Smokeless tobacco: Never   Tobacco comments:    Quit smoking 10/2018  Vaping Use   Vaping status: Every Day   Start date: 10/09/2018  Substance Use Topics   Alcohol use: Not Currently   Drug use: Not Currently     Pauline Garnette LABOR, MD 09/28/24 1127  "

## 2024-09-27 NOTE — Progress Notes (Signed)
 "    Diagnoses and Orders:   1. Prediabetes   2. Candidiasis, intertriginous   3. Hashimoto's thyroiditis   4. Migraine with aura and without status migrainosus, not intractable   5. Vitamin D  deficiency   6. BRCA gene mutation positive   7. ADHD (attention deficit hyperactivity disorder), inattentive type   8. Dysautonomia (HCC)   9. Vapes nicotine containing substance   10. Class 2 severe obesity with serious comorbidity and body mass index (BMI) of 36.0 to 36.9 in adult, unspecified obesity type    Meds ordered this encounter  Medications   fluconazole  (DIFLUCAN ) 150 MG tablet    Sig: Take 1 tablet (150 mg total) by mouth once a week.    Dispense:  2 tablet    Refill:  0   Orders Placed This Encounter  Procedures   POCT glycosylated hemoglobin (Hb A1C)   Assessment & Plan:   Assessment and Plan Assessment & Plan Dermatitis and cutaneous candidiasis Rash spreading, itchy, burning. Differential includes yeast infection due to lack of response to antibiotics. - Continue ketoconazole  cream. - Prescribed oral Diflucan .  Obesity Weight 241 pounds. Previous weight loss with GLP-1 agonists, but insurance no longer covers. Discussed benefits of weight loss and potential future coverage. - Ordered point of care A1c. - Discussed potential for future GLP-1 agonist coverage.  Prediabetes Previous A1c indicated prediabetes. Discussed potential use of CGM for further evaluation. - Ordered point of care A1c. - Will consider CGM for blood sugar monitoring.  Hashimoto's thyroiditis Thyroid  function at goal without medication. Previous palpitations with thyroid  medication.  Migraine Managed with Maxalt  and propranolol  for prevention and heart rate control. - Continue Maxalt  as needed. - Continue propranolol  daily for migraine prevention.  Genetic susceptibility to breast cancer (BRCA positive) BRCA positive due to family history. No estrogen therapy due to BRCA  status.  Vitamin D  deficiency Previously diagnosed, currently taking 50,000 IU weekly. - Continue vitamin D  supplementation.  Dysautonomia Symptoms since Covid vaccine. Managed with propranolol  for heart rate control. - Continue propranolol  for heart rate control.  Attention-deficit hyperactivity disorder (ADHD) Previously managed with Adderall. No current medication due to side effects with Wellbutrin.  Geni Shutter, DO, MS, FAAFP, Dipl. [facility] [address] [place], [address] Dept: [phone] Dept Fax: [phone]  Subjective:   History of Present Illness Amber Sweeney is a 43 year old female with Hashimoto's thyroiditis and BRCA positivity who presents with a worsening rash and weight management concerns.  Dermatologic symptoms - Worsening rash over the past week, initially a small dot on the stomach, now spreading over lower abdomen, under abdominal folds, and onto legs - Associated pruritus and burning sensation - Recent use of Bactrim  for 2-3 days, currently on doxycycline  for 2 days - Applying ketoconazole  cream topically - History of boils in childhood and abdominal abscess in October  Weight management and menopausal symptoms - Difficulty with weight loss, attributed to menopause and stress - Weight has ranged from 160 to 241 lb; feels best at 180 lb - Current diet includes protein shake for breakfast, vegetables for lunch, chicken with starch for dinner - Drinks approximately 2 gallons of water daily - Has eliminated sweet tea from diet - Owns a treadmill and plans to increase physical activity  Thyroid  dysfunction and palpitations - Hashimoto's thyroiditis - Discontinued thyroid  medication due to palpitations - Ongoing palpitations - Dysautonomia diagnosed after COVID-19 vaccination  Migraine management - Takes propranolol  daily for  migraine prevention - Uses Maxalt  and Reglan  as  needed  Genetic cancer risk and menopausal status - BRCA positive with strong family history of breast cancer - History of hysterectomy 10 years ago - Menopausal since hysterectomy - Takes vitamin D  50,000 IU weekly  Neuropsychiatric history - ADHD previously treated with Adderall - History of palpitations associated with stimulant use  Substance use - Former smoker - Currently vapes - No alcohol consumption  Review of Systems: Negative, with the exception of above mentioned in HPI.  History:   Reviewed by clinician on day of visit: allergies, medications, problem list, medical history, surgical history, family history, social history, and previous encounter notes.    Medications:   Show/hide medication list[1] Allergies[2]  Objective:   BP 122/78 (BP Location: Right Arm, Cuff Size: Normal)   Pulse 85   Ht 5' 8 (1.727 m)   Wt 241 lb (109.3 kg)   SpO2 97%   BMI 36.64 kg/m    Physical Exam Constitutional:      General: She is not in acute distress.    Appearance: She is well-developed.  HENT:     Head: Normocephalic and atraumatic.  Eyes:     Conjunctiva/sclera: Conjunctivae normal.  Cardiovascular:     Rate and Rhythm: Normal rate and regular rhythm.     Heart sounds: Normal heart sounds.  Pulmonary:     Effort: Pulmonary effort is normal.     Breath sounds: Normal breath sounds.  Neurological:     General: No focal deficit present.     Mental Status: She is alert.  Psychiatric:        Behavior: Behavior normal.       Results for orders placed or performed in visit on 09/18/24  POCT glycosylated hemoglobin (Hb A1C)   Collection Time: 09/18/24  4:44 PM  Result Value Ref Range   Hemoglobin A1C 5.9 (A) 4.0 - 5.6 %   HbA1c POC (<> result, manual entry)     HbA1c, POC (prediabetic range)     HbA1c, POC (controlled diabetic range)      Attestations:   Patient is establishing care in this system with me as PCP. Chart updated today with  reconciliation of problem list, medications, allergies, and relevant history. Preventive care and chronic disease status reviewed.  Portions of historical chart may remain incomplete; will update on an ongoing basis as clinically indicated.  Discussed the use of AI scribe software for clinical note transcription with the patient, who gave verbal consent to proceed.     [1]  Outpatient Medications Prior to Visit  Medication Sig   clonazePAM  (KLONOPIN ) 0.5 MG tablet Take 1-2 tablets (0.5-1 mg total) by mouth 2 (two) times daily for anxiety   [EXPIRED] doxycycline  (VIBRAMYCIN ) 100 MG capsule Take 1 capsule (100 mg total) by mouth 2 (two) times daily for 7 days.   ketoconazole  (NIZORAL ) 2 % cream Apply 1 Application topically daily for 14 days.   metoCLOPramide  (REGLAN ) 10 MG tablet Take 1 tablet (10 mg total) by mouth every 8 (eight) hours as needed for nausea.   rizatriptan  (MAXALT ) 10 MG tablet Take 1 tablet (10 mg total) by mouth as needed for migraine. May repeat in 2 hours if needed   valACYclovir  (VALTREX ) 1000 MG tablet Take 1 tablet by mouth 3 (three) times daily for 7 days   Vitamin D , Ergocalciferol , (DRISDOL ) 1.25 MG (50000 UNIT) CAPS capsule Take 1 capsule (50,000 Units total) by mouth every 7 (seven) days.   [DISCONTINUED]  hydrOXYzine  (ATARAX ) 25 MG tablet Take 1 tablet (25 mg total) by mouth every 8 (eight) hours as needed for itching.   [DISCONTINUED] propranolol  (INDERAL ) 20 MG tablet Take 1 tablet (20 mg total) by mouth 2 (two) times daily.   [DISCONTINUED] Cholecalciferol  (VITAMIN D ) 1.25 MG (50000 UT) CAPS Take 1 capsule by mouth every 30 (thirty) days.   [DISCONTINUED] UNABLE TO FIND Med Name: Compounded GLP1: 4 mg Semaglutide , NOA, and Bitamin B12   No facility-administered medications prior to visit.  [2]  Allergies Allergen Reactions   Iodinated Contrast Media Other (See Comments)    Panic Attack   Cephalosporins Rash    Had Rocephin  without reaction recenlty but had  rash as a child    Cyclosporine Hives   Sumatriptan Other (See Comments)    Made migraines worse   Celecoxib Other (See Comments)    Patient stated it gave her pancreatitis   Gabapentin  Palpitations   Levothyroxine Sodium Palpitations   Liraglutide -Weight Management Other (See Comments)    Injection site turned black for months   Pregabalin Diarrhea    Panic attack     Topiramate  Palpitations   Wellbutrin [Bupropion] Other (See Comments)    I didn't care about anything   "

## 2024-09-29 ENCOUNTER — Other Ambulatory Visit (HOSPITAL_COMMUNITY): Payer: Self-pay

## 2024-09-30 ENCOUNTER — Other Ambulatory Visit: Payer: Self-pay

## 2024-10-04 ENCOUNTER — Ambulatory Visit: Payer: Self-pay

## 2024-10-10 ENCOUNTER — Encounter: Payer: Self-pay | Admitting: Family

## 2024-10-20 ENCOUNTER — Ambulatory Visit: Admitting: Family Medicine

## 2024-10-20 NOTE — Progress Notes (Unsigned)
 "    Patient Care Team: Prentiss Frieze, DO as PCP - General (Family Medicine) Lonni Slain, MD as PCP - Cardiology (Cardiology)  Weight Management:   Starting weight: *** Starting date: *** Today's weight: *** Today's date: 10/20/2024 Total lbs lost to date: *** Total lbs lost since last in-office visit: *** Total weight loss percentage to date: -***% There is no height or weight on file to calculate BMI.   Nutrition Plan: {EW NUTRITION HNJOD:65522}. Anti-obesity medications (including off-label): ***. Reported side effects: ***. Hunger is {EWCONTROLASSESSMENT:24261}. Cravings are {EWCONTROLASSESSMENT:24261}. Activity: {MWM EXERCISE RECS:23473}. Sleep: Number of hours slept each night: ***. Sleep {ACTION; IS/IS NOT:21021397} restful.   Diagnoses and Orders:   No diagnosis found. No orders of the defined types were placed in this encounter.  No orders of the defined types were placed in this encounter.  Assessment & Plan:   Assessment and Plan Assessment & Plan     Frieze Prentiss, DO, MS, FAAFP, Dipl. [facility] [address] [place], [address] Dept: [phone] Dept Fax: [phone]  Subjective:   History of Present Illness   Review of Systems: Negative, with the exception of above mentioned in HPI.  History:   Reviewed by clinician on day of visit: allergies, medications, problem list, medical history, surgical history, family history, social history, and previous encounter notes.  Medications:   Show/hide medication list[1] Allergies[2]  Objective:   There were no vitals taken for this visit. {Insert last BP/Wt (optional):23777}{See vitals history (optional):1}    Physical Exam  {Insert previous labs (optional):23779} {See past labs  Heme  Chem  Endocrine  Serology  Results Review (optional):1}  Results for orders placed or performed in visit on 09/18/24  POCT glycosylated  hemoglobin (Hb A1C)   Collection Time: 09/18/24  4:44 PM  Result Value Ref Range   Hemoglobin A1C 5.9 (A) 4.0 - 5.6 %   HbA1c POC (<> result, manual entry)     HbA1c, POC (prediabetic range)     HbA1c, POC (controlled diabetic range)      Attestations:   {EW ATTESTATIONS:34266}  {ACUTE VISIT ATTESTATION:34267}  {EW MEDICARE SCREENING AND COUNSELING:34331}  Frieze Prentiss, DO, MS, FAAFP, Dipl. [facility] at [facility] [address] [place], [address] Dept: [phone] Dept Fax: [phone]    [1]  Outpatient Medications Prior to Visit  Medication Sig   clonazePAM  (KLONOPIN ) 0.5 MG tablet Take 1-2 tablets (0.5-1 mg total) by mouth 2 (two) times daily for anxiety   fluconazole  (DIFLUCAN ) 150 MG tablet Take 1 tablet (150 mg total) by mouth once a week.   metoCLOPramide  (REGLAN ) 10 MG tablet Take 1 tablet (10 mg total) by mouth every 8 (eight) hours as needed for nausea.   predniSONE  (DELTASONE ) 20 MG tablet Take one tab by mouth twice daily for 4 days, then one daily for 3 days. Take with food.   propranolol  (INDERAL ) 20 MG tablet Take 1 tablet (20 mg total) by mouth 2 (two) times daily.   rizatriptan  (MAXALT ) 10 MG tablet Take 1 tablet (10 mg total) by mouth as needed for migraine. May repeat in 2 hours if needed   valACYclovir  (VALTREX ) 1000 MG tablet Take 1 tablet by mouth 3 (three) times daily for 7 days   Vitamin D , Ergocalciferol , (DRISDOL ) 1.25 MG (50000 UNIT) CAPS capsule Take 1 capsule (50,000 Units total) by mouth every 7 (seven) days.   No facility-administered medications prior to visit.  [2]  Allergies  Allergen Reactions   Iodinated Contrast Media Other (See Comments)    Panic Attack   Cephalosporins Rash    Had Rocephin  without reaction recenlty but had rash as a child    Cyclosporine Hives   Sumatriptan Other (See Comments)    Made migraines worse   Celecoxib Other (See Comments)    Patient stated it gave her  pancreatitis   Gabapentin  Palpitations   Levothyroxine Sodium Palpitations   Liraglutide -Weight Management Other (See Comments)    Injection site turned black for months   Pregabalin Diarrhea    Panic attack     Topiramate  Palpitations   Wellbutrin [Bupropion] Other (See Comments)    I didn't care about anything   "

## 2024-10-27 ENCOUNTER — Encounter: Payer: Self-pay | Admitting: Neurology

## 2024-10-28 ENCOUNTER — Encounter (HOSPITAL_BASED_OUTPATIENT_CLINIC_OR_DEPARTMENT_OTHER): Payer: Self-pay

## 2024-10-28 ENCOUNTER — Ambulatory Visit (HOSPITAL_BASED_OUTPATIENT_CLINIC_OR_DEPARTMENT_OTHER): Admitting: Student

## 2024-10-28 ENCOUNTER — Encounter: Payer: Self-pay | Admitting: Family

## 2024-10-28 ENCOUNTER — Other Ambulatory Visit: Payer: Self-pay

## 2024-11-03 ENCOUNTER — Other Ambulatory Visit (HOSPITAL_BASED_OUTPATIENT_CLINIC_OR_DEPARTMENT_OTHER)

## 2024-11-14 ENCOUNTER — Encounter: Payer: Self-pay | Admitting: Family Medicine

## 2024-11-14 ENCOUNTER — Ambulatory Visit: Admitting: Family Medicine

## 2024-11-14 DIAGNOSIS — G8929 Other chronic pain: Secondary | ICD-10-CM | POA: Insufficient documentation

## 2024-11-14 DIAGNOSIS — R002 Palpitations: Secondary | ICD-10-CM

## 2024-11-14 DIAGNOSIS — E559 Vitamin D deficiency, unspecified: Secondary | ICD-10-CM

## 2024-11-14 DIAGNOSIS — M62838 Other muscle spasm: Secondary | ICD-10-CM | POA: Insufficient documentation

## 2024-11-14 DIAGNOSIS — E538 Deficiency of other specified B group vitamins: Secondary | ICD-10-CM

## 2024-11-14 LAB — LIPID PANEL
Cholesterol: 199 mg/dL (ref 28–200)
HDL: 45.6 mg/dL
LDL Cholesterol: 126 mg/dL — ABNORMAL HIGH (ref 10–99)
NonHDL: 153.76
Total CHOL/HDL Ratio: 4
Triglycerides: 137 mg/dL (ref 10.0–149.0)
VLDL: 27.4 mg/dL (ref 0.0–40.0)

## 2024-11-14 LAB — COMPREHENSIVE METABOLIC PANEL WITH GFR
ALT: 20 U/L (ref 3–35)
AST: 16 U/L (ref 5–37)
Albumin: 4.4 g/dL (ref 3.5–5.2)
Alkaline Phosphatase: 62 U/L (ref 39–117)
BUN: 19 mg/dL (ref 6–23)
CO2: 28 meq/L (ref 19–32)
Calcium: 9.1 mg/dL (ref 8.4–10.5)
Chloride: 104 meq/L (ref 96–112)
Creatinine, Ser: 0.89 mg/dL (ref 0.40–1.20)
GFR: 78.98 mL/min
Glucose, Bld: 98 mg/dL (ref 70–99)
Potassium: 4.1 meq/L (ref 3.5–5.1)
Sodium: 140 meq/L (ref 135–145)
Total Bilirubin: 0.4 mg/dL (ref 0.2–1.2)
Total Protein: 7.3 g/dL (ref 6.0–8.3)

## 2024-11-14 LAB — CBC WITH DIFFERENTIAL/PLATELET
Basophils Absolute: 0 10*3/uL (ref 0.0–0.1)
Basophils Relative: 0.6 % (ref 0.0–3.0)
Eosinophils Absolute: 0.1 10*3/uL (ref 0.0–0.7)
Eosinophils Relative: 1.7 % (ref 0.0–5.0)
HCT: 41.6 % (ref 36.0–46.0)
Hemoglobin: 14.1 g/dL (ref 12.0–15.0)
Lymphocytes Relative: 31.7 % (ref 12.0–46.0)
Lymphs Abs: 2.1 10*3/uL (ref 0.7–4.0)
MCHC: 33.9 g/dL (ref 30.0–36.0)
MCV: 95.2 fl (ref 78.0–100.0)
Monocytes Absolute: 0.4 10*3/uL (ref 0.1–1.0)
Monocytes Relative: 5.5 % (ref 3.0–12.0)
Neutro Abs: 4 10*3/uL (ref 1.4–7.7)
Neutrophils Relative %: 60.5 % (ref 43.0–77.0)
Platelets: 286 10*3/uL (ref 150.0–400.0)
RBC: 4.37 Mil/uL (ref 3.87–5.11)
RDW: 13.6 % (ref 11.5–15.5)
WBC: 6.5 10*3/uL (ref 4.0–10.5)

## 2024-11-14 LAB — VITAMIN B12: Vitamin B-12: 475 pg/mL (ref 211–911)

## 2024-11-14 LAB — MAGNESIUM: Magnesium: 2.2 mg/dL (ref 1.5–2.5)

## 2024-11-14 LAB — TSH: TSH: 3.29 u[IU]/mL (ref 0.35–5.50)

## 2024-11-14 LAB — T4, FREE: Free T4: 0.73 ng/dL (ref 0.60–1.60)

## 2024-11-14 NOTE — Progress Notes (Unsigned)
 "    Patient Care Team: Prentiss Frieze, DO as PCP - General (Family Medicine) Lonni Slain, MD as PCP - Cardiology (Cardiology)  Weight Management:   Starting weight: 09/18/2024 Starting date: 241 lb Today's weight: 233 lb Today's date: 11/14/2024 Total lbs lost to date: *** Total lbs lost since last in-office visit: 8 Total weight loss percentage to date: -***% Body mass index is 35.43 kg/m.   Nutrition Plan: Eat protein with every meal/snack , Prioritize whole, minimally processed foods , Improve hydration, Mindful portions, and Address emotional or stress-related eating. Activity: Walking two hours daily on treadmill.  Diagnoses and Orders:   1. Hereditary hemochromatosis   2. Palpitations   3. Chronic left shoulder pain   4. Spasm of abdominal muscles of left side   5. Vitamin D  deficiency   6. B12 deficiency    Orders Placed This Encounter  Procedures   CBC with Differential/Platelet   Comprehensive metabolic panel with GFR   Lipid panel   TSH   T4, free   Magnesium   Iron, TIBC and Ferritin Panel   Vitamin B12   Ambulatory referral to Sports Medicine   Assessment & Plan:   Assessment & Plan Abdominal wall muscle spasm Intermittent abdominal wall muscle spasms, likely due to muscle weakness or positional factors. Occurs infrequently, approximately five to six times over the past couple of years, with recent episodes twice in the past two weeks. No indication of gastrointestinal involvement. - Ensure adequate magnesium and electrolyte intake to prevent muscle spasms.  Shoulder pain with limited abduction Shoulder pain with limited abduction, present for two months. Pain exacerbated by certain positions and movements, possibly related to sleeping position. No clear history of injury. Differential includes rotator cuff pathology or other musculoskeletal issues. - Referred to Sports Medicine for further evaluation and management. - Will consider  imaging studies such as MRI or ultrasound if indicated. - Will discuss potential for injection and physical therapy if necessary.  Hashimoto's thyroiditis Hashimoto's thyroiditis, currently not on thyroid  medication. Reports palpitations, possibly related to thyroid  dysfunction or stress. Differential includes stress-related palpitations or thyroid  dysfunction. - Ordered thyroid  function tests to assess current thyroid  status.  Class 2 severe obesity Recent weight loss of 7-8 pounds. Engaging in regular physical activity, walking two miles daily, which has contributed to weight loss and anxiety reduction. - Continue current exercise regimen to promote weight loss and improve overall health.  Anxiety Experiencing anxiety, possibly exacerbated by current life stressors. Engaging in regular physical activity, which has been beneficial. Prefers not to resume previous medications due to past experiences of emotional blunting. - Encouraged continuation of physical activity as a non-pharmacological intervention for anxiety. - Discussed potential for therapy if anxiety becomes unmanageable.  General Health Maintenance Engaging in regular physical activity, which is beneficial for overall health and anxiety management. - Continue regular physical activity, such as walking, to support mental and physical health.  Subjective:   History of Present Illness Amber Sweeney is a 44 year old female with Hashimoto's thyroiditis who presents with abdominal muscle spasms and shoulder pain.  Abdominal muscle spasms - Episodes occur approximately five or six times over the past few years, with two episodes in the last two weeks - Described as a 'cramp' or 'Charlie horse feeling' in the abdominal area - Spasms occur only when sitting and are relieved by lying flat - Affected area feels hard during episodes  Right shoulder pain - Pain present for about two months, with sudden  onset and no known  injury - Pain worsens with arm movements such as lifting or sleeping on her stomach - Sleeps with a pillow under her arm for relief - No improvement with one month of rotator cuff stretches - Pain limits ability to fully abduct the arm - No recent injuries to the shoulder  Palpitations - Experiences palpitations - Concerned about possible relation to stress, Hashimoto's thyroiditis, or not donating blood recently  Hashimoto's thyroiditis - Diagnosed with Hashimoto's thyroiditis - Not currently taking thyroid  medication  Psychological stress and anxiety - Experiencing high stress levels, currently in the process of moving - Increased use of clonazepam  for stress management - Hesitant to resume medications such as Prozac , which she has used in the past - Initiated daily walking of two miles, which helps manage anxiety - Able to sleep well and maintain focus on school and work responsibilities  Review of Systems: Negative, with the exception of above mentioned in HPI.  History:   Reviewed by clinician on day of visit: allergies, medications, problem list, medical history, surgical history, family history, social history, and previous encounter notes.  Medications:   Show/hide medication list[1] Allergies[2]  Physical Exam:   BP 114/80   Pulse 74   Ht 5' 8 (1.727 m)   Wt 233 lb (105.7 kg)   SpO2 96%   BMI 35.43 kg/m   Physical Exam Constitutional:      General: She is not in acute distress.    Appearance: She is well-developed.  HENT:     Head: Normocephalic and atraumatic.  Eyes:     Conjunctiva/sclera: Conjunctivae normal.  Cardiovascular:     Rate and Rhythm: Normal rate and regular rhythm.     Heart sounds: Normal heart sounds.  Pulmonary:     Effort: Pulmonary effort is normal.     Breath sounds: Normal breath sounds.  Musculoskeletal:     Left shoulder: Tenderness present. Decreased range of motion.  Neurological:     General: No focal deficit present.      Mental Status: She is alert.  Psychiatric:        Behavior: Behavior normal.    Laboratory and Imaging:   {Insert previous labs (optional):23779} {See past labs  Heme  Chem  Endocrine  Serology  Results Review (optional):1} Results for orders placed or performed in visit on 09/18/24  POCT glycosylated hemoglobin (Hb A1C)   Collection Time: 09/18/24  4:44 PM  Result Value Ref Range   Hemoglobin A1C 5.9 (A) 4.0 - 5.6 %   HbA1c POC (<> result, manual entry)     HbA1c, POC (prediabetic range)     HbA1c, POC (controlled diabetic range)      Screening:   11/14/2024    PHQ2-9 Depression Screening   Little interest or pleasure in doing things    Feeling down, depressed, or hopeless    PHQ-2 - Total Score    Trouble falling or staying asleep, or sleeping too much    Feeling tired or having little energy    Poor appetite or overeating     Feeling bad about yourself - or that you are a failure or have let yourself or your family down    Trouble concentrating on things, such as reading the newspaper or watching television    Moving or speaking so slowly that other people could have noticed.  Or the opposite - being so fidgety or restless that you have been moving around a lot more than usual  Thoughts that you would be better off dead, or hurting yourself in some way    PHQ2-9 Total Score    If you checked off any problems, how difficult have these problems made it for you to do your work, take care of things at home, or get along with other people    Depression Interventions/Treatment         09/18/2024    3:29 PM 10/30/2023    9:28 AM 01/29/2023    2:10 PM  GAD 7 : Generalized Anxiety Score  Nervous, Anxious, on Edge 1  3  1    Control/stop worrying 3  3  0   Worry too much - different things 3  3  0   Trouble relaxing 1  3  0   Restless 0  3  0   Easily annoyed or irritable 1  1  1    Afraid - awful might happen 3  0  0   Total GAD 7 Score 12 16 2   Anxiety Difficulty  Somewhat difficult Not difficult at all Not difficult at all     Data saved with a previous flowsheet row definition   Attestations:   {EW ATTESTATIONS:34266}  Geni Shutter, DO, MS (Nutrition), FAAFP, Dipl. ABOM Fellow, [employer] Academy [employer] Diplomate, American Board of Obesity Medicine [facility] at [facility] [address] [place], [place] [address] Dept: [phone] Fax: [phone]       [1] Outpatient Medications Prior to Visit  Medication Sig   clonazePAM  (KLONOPIN ) 0.5 MG tablet Take 1-2 tablets (0.5-1 mg total) by mouth 2 (two) times daily for anxiety   metoCLOPramide  (REGLAN ) 10 MG tablet Take 1 tablet (10 mg total) by mouth every 8 (eight) hours as needed for nausea.   propranolol  (INDERAL ) 20 MG tablet Take 1 tablet (20 mg total) by mouth 2 (two) times daily.   rizatriptan  (MAXALT ) 10 MG tablet Take 1 tablet (10 mg total) by mouth as needed for migraine. May repeat in 2 hours if needed   valACYclovir  (VALTREX ) 1000 MG tablet Take 1 tablet by mouth 3 (three) times daily for 7 days   Vitamin D , Ergocalciferol , (DRISDOL ) 1.25 MG (50000 UNIT) CAPS capsule Take 1 capsule (50,000 Units total) by mouth every 7 (seven) days.   [DISCONTINUED] fluconazole  (DIFLUCAN ) 150 MG tablet Take 1 tablet (150 mg total) by mouth once a week.   [DISCONTINUED] predniSONE  (DELTASONE ) 20 MG tablet Take one tab by mouth twice daily for 4 days, then one daily for 3 days. Take with food.   No facility-administered medications prior to visit.  [2] Allergies Allergen Reactions   Iodinated Contrast Media Other (See Comments)    Panic Attack   Cephalosporins Rash    Had Rocephin  without reaction recenlty but had rash as a child    Cyclosporine Hives   Sumatriptan Other (See Comments)    Made migraines worse   Celecoxib Other (See Comments)    Patient stated it gave her pancreatitis   Gabapentin  Palpitations    Levothyroxine Sodium Palpitations   Liraglutide -Weight Management Other (See Comments)    Injection site turned black for months   Pregabalin Diarrhea    Panic attack     Topiramate  Palpitations   Wellbutrin [Bupropion] Other (See Comments)    I didn't care about anything  "

## 2024-11-24 ENCOUNTER — Other Ambulatory Visit (HOSPITAL_BASED_OUTPATIENT_CLINIC_OR_DEPARTMENT_OTHER)

## 2024-12-08 ENCOUNTER — Ambulatory Visit: Admitting: Family Medicine

## 2024-12-09 ENCOUNTER — Ambulatory Visit: Admitting: Physician Assistant

## 2024-12-11 ENCOUNTER — Other Ambulatory Visit (HOSPITAL_BASED_OUTPATIENT_CLINIC_OR_DEPARTMENT_OTHER)

## 2025-01-12 ENCOUNTER — Ambulatory Visit: Admitting: Family Medicine

## 2025-01-15 ENCOUNTER — Ambulatory Visit: Admitting: Neurology
